# Patient Record
Sex: Male | Born: 1940 | Race: White | Hispanic: No | Marital: Married | Smoking: Former smoker
Health system: Southern US, Community
[De-identification: ages and names within clinical notes are randomized; demographics above are authoritative.]

## PROBLEM LIST (undated history)

## (undated) DIAGNOSIS — I10 Essential (primary) hypertension: Secondary | ICD-10-CM

## (undated) DIAGNOSIS — R209 Unspecified disturbances of skin sensation: Secondary | ICD-10-CM

## (undated) DIAGNOSIS — R972 Elevated prostate specific antigen [PSA]: Secondary | ICD-10-CM

## (undated) DIAGNOSIS — Z86010 Personal history of colonic polyps: Secondary | ICD-10-CM

## (undated) DIAGNOSIS — I6529 Occlusion and stenosis of unspecified carotid artery: Secondary | ICD-10-CM

## (undated) DIAGNOSIS — F528 Other sexual dysfunction not due to a substance or known physiological condition: Secondary | ICD-10-CM

## (undated) DIAGNOSIS — E785 Hyperlipidemia, unspecified: Secondary | ICD-10-CM

## (undated) DIAGNOSIS — M545 Low back pain: Secondary | ICD-10-CM

## (undated) DIAGNOSIS — M5430 Sciatica, unspecified side: Secondary | ICD-10-CM

## (undated) DIAGNOSIS — N400 Enlarged prostate without lower urinary tract symptoms: Secondary | ICD-10-CM

## (undated) DIAGNOSIS — I739 Peripheral vascular disease, unspecified: Secondary | ICD-10-CM

## (undated) DIAGNOSIS — M19049 Primary osteoarthritis, unspecified hand: Secondary | ICD-10-CM

## (undated) DIAGNOSIS — K219 Gastro-esophageal reflux disease without esophagitis: Secondary | ICD-10-CM

## (undated) HISTORY — DX: Sciatica, unspecified side: M54.30

## (undated) HISTORY — DX: Gastro-esophageal reflux disease without esophagitis: K21.9

## (undated) HISTORY — DX: Primary osteoarthritis, unspecified hand: M19.049

## (undated) HISTORY — DX: Other sexual dysfunction not due to a substance or known physiological condition: F52.8

## (undated) HISTORY — PX: PROSTATE BIOPSY: SHX241

## (undated) HISTORY — DX: Hyperlipidemia, unspecified: E78.5

## (undated) HISTORY — PX: CARPAL TUNNEL RELEASE: SHX101

## (undated) HISTORY — DX: Occlusion and stenosis of unspecified carotid artery: I65.29

## (undated) HISTORY — DX: Peripheral vascular disease, unspecified: I73.9

## (undated) HISTORY — DX: Essential (primary) hypertension: I10

## (undated) HISTORY — PX: COLONOSCOPY: SHX174

## (undated) HISTORY — DX: Low back pain: M54.5

## (undated) HISTORY — DX: Elevated prostate specific antigen (PSA): R97.20

## (undated) HISTORY — DX: Personal history of colonic polyps: Z86.010

## (undated) HISTORY — DX: Unspecified disturbances of skin sensation: R20.9

## (undated) HISTORY — DX: Benign prostatic hyperplasia without lower urinary tract symptoms: N40.0

---

## 1999-05-02 HISTORY — PX: INGUINAL HERNIA REPAIR: SHX194

## 2004-11-08 ENCOUNTER — Ambulatory Visit: Payer: Self-pay | Admitting: Internal Medicine

## 2005-11-16 ENCOUNTER — Ambulatory Visit: Payer: Self-pay | Admitting: Internal Medicine

## 2005-11-29 ENCOUNTER — Ambulatory Visit: Payer: Self-pay | Admitting: Internal Medicine

## 2006-07-05 ENCOUNTER — Ambulatory Visit: Payer: Self-pay | Admitting: Internal Medicine

## 2006-11-06 ENCOUNTER — Ambulatory Visit: Payer: Self-pay | Admitting: Internal Medicine

## 2006-11-06 LAB — CONVERTED CEMR LAB
ALT: 18 units/L (ref 0–53)
Alkaline Phosphatase: 47 units/L (ref 39–117)
BUN: 18 mg/dL (ref 6–23)
Basophils Relative: 0.4 % (ref 0.0–1.0)
Bilirubin Urine: NEGATIVE
CO2: 28 meq/L (ref 19–32)
Calcium: 9.6 mg/dL (ref 8.4–10.5)
Creatinine, Ser: 1 mg/dL (ref 0.4–1.5)
Hemoglobin: 15.2 g/dL (ref 13.0–17.0)
Leukocytes, UA: NEGATIVE
Monocytes Absolute: 0.5 10*3/uL (ref 0.2–0.7)
Monocytes Relative: 9.4 % (ref 3.0–11.0)
Nitrite: NEGATIVE
Platelets: 236 10*3/uL (ref 150–400)
RDW: 12.9 % (ref 11.5–14.6)
Specific Gravity, Urine: >=1.03 (ref 1.000–1.03)
Total Bilirubin: 1.3 mg/dL — ABNORMAL HIGH (ref 0.3–1.2)
Total Protein, Urine: NEGATIVE mg/dL
Total Protein: 6.8 g/dL (ref 6.0–8.3)
Triglycerides: 51 mg/dL (ref 0–149)
VLDL: 10 mg/dL (ref 0–40)
WBC: 5.3 10*3/uL (ref 4.5–10.5)
pH: 6 (ref 5.0–8.0)

## 2006-11-13 ENCOUNTER — Ambulatory Visit: Payer: Self-pay | Admitting: Internal Medicine

## 2006-11-15 DIAGNOSIS — K219 Gastro-esophageal reflux disease without esophagitis: Secondary | ICD-10-CM | POA: Insufficient documentation

## 2006-11-15 DIAGNOSIS — M5430 Sciatica, unspecified side: Secondary | ICD-10-CM | POA: Insufficient documentation

## 2006-11-15 DIAGNOSIS — E785 Hyperlipidemia, unspecified: Secondary | ICD-10-CM

## 2006-11-15 DIAGNOSIS — Z860100 Personal history of colon polyps, unspecified: Secondary | ICD-10-CM | POA: Insufficient documentation

## 2006-11-15 DIAGNOSIS — F528 Other sexual dysfunction not due to a substance or known physiological condition: Secondary | ICD-10-CM

## 2006-11-15 DIAGNOSIS — N400 Enlarged prostate without lower urinary tract symptoms: Secondary | ICD-10-CM

## 2006-11-15 DIAGNOSIS — I10 Essential (primary) hypertension: Secondary | ICD-10-CM

## 2006-11-15 HISTORY — DX: Hyperlipidemia, unspecified: E78.5

## 2006-11-15 HISTORY — DX: Sciatica, unspecified side: M54.30

## 2006-11-15 HISTORY — DX: Gastro-esophageal reflux disease without esophagitis: K21.9

## 2006-11-15 HISTORY — DX: Other sexual dysfunction not due to a substance or known physiological condition: F52.8

## 2006-11-15 HISTORY — DX: Personal history of colonic polyps: Z86.010

## 2006-11-15 HISTORY — DX: Personal history of colon polyps, unspecified: Z86.0100

## 2006-11-15 HISTORY — DX: Benign prostatic hyperplasia without lower urinary tract symptoms: N40.0

## 2006-11-15 HISTORY — DX: Essential (primary) hypertension: I10

## 2007-01-30 ENCOUNTER — Ambulatory Visit: Payer: Self-pay | Admitting: Internal Medicine

## 2007-01-30 LAB — CONVERTED CEMR LAB
ALT: 22 U/L
AST: 25 U/L
Albumin: 3.8 g/dL
Alkaline Phosphatase: 60 U/L
Bilirubin, Direct: 0.2 mg/dL
Cholesterol: 161 mg/dL
HDL: 60.2 mg/dL
LDL Cholesterol: 91 mg/dL
PSA, Free Pct: 17 — ABNORMAL LOW
PSA, Free: 0.6 ng/mL
PSA: 3.41 ng/mL
PSA: 3.54 ng/mL
Total Bilirubin: 1.1 mg/dL
Total CHOL/HDL Ratio: 2.7
Total Protein: 6.9 g/dL
Triglycerides: 51 mg/dL
VLDL: 10 mg/dL

## 2007-02-28 ENCOUNTER — Telehealth (INDEPENDENT_AMBULATORY_CARE_PROVIDER_SITE_OTHER): Payer: Self-pay | Admitting: *Deleted

## 2007-07-18 ENCOUNTER — Encounter: Payer: Self-pay | Admitting: Internal Medicine

## 2007-11-12 ENCOUNTER — Ambulatory Visit: Payer: Self-pay | Admitting: Internal Medicine

## 2007-11-12 LAB — CONVERTED CEMR LAB
ALT: 18 units/L (ref 0–53)
AST: 20 units/L (ref 0–37)
Albumin: 4.1 g/dL (ref 3.5–5.2)
BUN: 18 mg/dL (ref 6–23)
Basophils Absolute: 0 10*3/uL (ref 0.0–0.1)
Basophils Relative: 0.1 % (ref 0.0–1.0)
CO2: 28 meq/L (ref 19–32)
Calcium: 9.5 mg/dL (ref 8.4–10.5)
Cholesterol: 155 mg/dL (ref 0–200)
Creatinine, Ser: 1.1 mg/dL (ref 0.4–1.5)
Eosinophils Relative: 3.2 % (ref 0.0–5.0)
Glucose, Bld: 99 mg/dL (ref 70–99)
Hemoglobin: 15.6 g/dL (ref 13.0–17.0)
Ketones, ur: NEGATIVE mg/dL
Leukocytes, UA: NEGATIVE
Lymphocytes Relative: 25.5 % (ref 12.0–46.0)
MCHC: 34.7 g/dL (ref 30.0–36.0)
Monocytes Relative: 8 % (ref 3.0–12.0)
Neutro Abs: 3.5 10*3/uL (ref 1.4–7.7)
PSA: 4.81 ng/mL — ABNORMAL HIGH (ref 0.10–4.00)
RBC: 4.87 M/uL (ref 4.22–5.81)
Specific Gravity, Urine: 1.025 (ref 1.000–1.03)
TSH: 0.91 microintl units/mL (ref 0.35–5.50)
Total CHOL/HDL Ratio: 2.8
Total Protein: 7 g/dL (ref 6.0–8.3)
Triglycerides: 62 mg/dL (ref 0–149)
Urine Glucose: NEGATIVE mg/dL
WBC: 5.7 10*3/uL (ref 4.5–10.5)
pH: 6 (ref 5.0–8.0)

## 2007-11-14 ENCOUNTER — Ambulatory Visit: Payer: Self-pay | Admitting: Internal Medicine

## 2007-11-14 DIAGNOSIS — M545 Low back pain, unspecified: Secondary | ICD-10-CM

## 2007-11-14 DIAGNOSIS — R972 Elevated prostate specific antigen [PSA]: Secondary | ICD-10-CM | POA: Insufficient documentation

## 2007-11-14 DIAGNOSIS — I6529 Occlusion and stenosis of unspecified carotid artery: Secondary | ICD-10-CM

## 2007-11-14 HISTORY — DX: Occlusion and stenosis of unspecified carotid artery: I65.29

## 2007-11-14 HISTORY — DX: Low back pain, unspecified: M54.50

## 2007-11-14 HISTORY — DX: Elevated prostate specific antigen (PSA): R97.20

## 2007-11-19 ENCOUNTER — Ambulatory Visit: Payer: Self-pay

## 2007-11-19 ENCOUNTER — Encounter: Payer: Self-pay | Admitting: Internal Medicine

## 2007-11-20 ENCOUNTER — Encounter: Payer: Self-pay | Admitting: Internal Medicine

## 2008-11-09 ENCOUNTER — Encounter: Payer: Self-pay | Admitting: Internal Medicine

## 2008-11-16 ENCOUNTER — Ambulatory Visit: Payer: Self-pay | Admitting: Internal Medicine

## 2008-11-16 LAB — CONVERTED CEMR LAB
AST: 19 units/L (ref 0–37)
Albumin: 4 g/dL (ref 3.5–5.2)
Alkaline Phosphatase: 47 units/L (ref 39–117)
Basophils Absolute: 0 10*3/uL (ref 0.0–0.1)
Basophils Relative: 0.1 % (ref 0.0–3.0)
Bilirubin Urine: NEGATIVE
CO2: 29 meq/L (ref 19–32)
Eosinophils Absolute: 0.1 10*3/uL (ref 0.0–0.7)
Glucose, Bld: 89 mg/dL (ref 70–99)
HCT: 47 % (ref 39.0–52.0)
Hemoglobin, Urine: NEGATIVE
Hemoglobin: 16.2 g/dL (ref 13.0–17.0)
Ketones, ur: NEGATIVE mg/dL
Leukocytes, UA: NEGATIVE
Lymphocytes Relative: 23.6 % (ref 12.0–46.0)
Lymphs Abs: 1.5 10*3/uL (ref 0.7–4.0)
MCHC: 34.6 g/dL (ref 30.0–36.0)
Monocytes Relative: 7.2 % (ref 3.0–12.0)
Neutro Abs: 4.2 10*3/uL (ref 1.4–7.7)
Potassium: 4 meq/L (ref 3.5–5.1)
RBC: 5.16 M/uL (ref 4.22–5.81)
RDW: 12.8 % (ref 11.5–14.6)
Sodium: 141 meq/L (ref 135–145)
Specific Gravity, Urine: 1.015 (ref 1.000–1.030)
TSH: 0.82 microintl units/mL (ref 0.35–5.50)
Total CHOL/HDL Ratio: 3
Total Protein: 7 g/dL (ref 6.0–8.3)
Urine Glucose: NEGATIVE mg/dL
Urobilinogen, UA: 0.2 (ref 0.0–1.0)

## 2008-11-23 ENCOUNTER — Ambulatory Visit: Payer: Self-pay | Admitting: Internal Medicine

## 2008-11-23 DIAGNOSIS — R209 Unspecified disturbances of skin sensation: Secondary | ICD-10-CM | POA: Insufficient documentation

## 2008-11-23 HISTORY — DX: Unspecified disturbances of skin sensation: R20.9

## 2009-03-08 ENCOUNTER — Ambulatory Visit: Payer: Self-pay | Admitting: Internal Medicine

## 2009-03-08 LAB — CONVERTED CEMR LAB
Alkaline Phosphatase: 56 units/L (ref 39–117)
Bilirubin, Direct: 0.1 mg/dL (ref 0.0–0.3)
Cholesterol: 172 mg/dL (ref 0–200)
LDL Cholesterol: 90 mg/dL (ref 0–99)
Total Bilirubin: 0.9 mg/dL (ref 0.3–1.2)
Total CHOL/HDL Ratio: 3
Total Protein: 8 g/dL (ref 6.0–8.3)
VLDL: 17 mg/dL (ref 0.0–40.0)

## 2009-04-20 ENCOUNTER — Ambulatory Visit: Payer: Self-pay | Admitting: Internal Medicine

## 2009-08-12 ENCOUNTER — Encounter: Payer: Self-pay | Admitting: Internal Medicine

## 2009-10-04 ENCOUNTER — Ambulatory Visit: Payer: Self-pay | Admitting: Internal Medicine

## 2009-10-04 DIAGNOSIS — M25519 Pain in unspecified shoulder: Secondary | ICD-10-CM

## 2009-11-02 ENCOUNTER — Encounter: Payer: Self-pay | Admitting: Internal Medicine

## 2009-11-03 ENCOUNTER — Telehealth (INDEPENDENT_AMBULATORY_CARE_PROVIDER_SITE_OTHER): Payer: Self-pay | Admitting: *Deleted

## 2009-11-16 ENCOUNTER — Encounter: Payer: Self-pay | Admitting: Internal Medicine

## 2009-11-17 ENCOUNTER — Encounter: Payer: Self-pay | Admitting: Internal Medicine

## 2009-11-17 ENCOUNTER — Ambulatory Visit: Payer: Self-pay

## 2009-11-19 ENCOUNTER — Ambulatory Visit: Payer: Self-pay | Admitting: Internal Medicine

## 2009-11-19 LAB — CONVERTED CEMR LAB
AST: 21 units/L (ref 0–37)
Alkaline Phosphatase: 48 units/L (ref 39–117)
BUN: 20 mg/dL (ref 6–23)
Basophils Absolute: 0 10*3/uL (ref 0.0–0.1)
Basophils Relative: 0.7 % (ref 0.0–3.0)
Bilirubin, Direct: 0.1 mg/dL (ref 0.0–0.3)
CO2: 28 meq/L (ref 19–32)
Chloride: 106 meq/L (ref 96–112)
Cholesterol: 156 mg/dL (ref 0–200)
Creatinine, Ser: 0.9 mg/dL (ref 0.4–1.5)
Eosinophils Absolute: 0.2 10*3/uL (ref 0.0–0.7)
Glucose, Bld: 91 mg/dL (ref 70–99)
Hemoglobin: 15.1 g/dL (ref 13.0–17.0)
LDL Cholesterol: 80 mg/dL (ref 0–99)
Lymphocytes Relative: 21.8 % (ref 12.0–46.0)
MCHC: 34 g/dL (ref 30.0–36.0)
Monocytes Relative: 8.2 % (ref 3.0–12.0)
Neutrophils Relative %: 66.5 % (ref 43.0–77.0)
Nitrite: NEGATIVE
Potassium: 5 meq/L (ref 3.5–5.1)
RBC: 4.77 M/uL (ref 4.22–5.81)
Total CHOL/HDL Ratio: 2
Total Protein, Urine: NEGATIVE mg/dL
Total Protein: 6.6 g/dL (ref 6.0–8.3)
Urine Glucose: NEGATIVE mg/dL
VLDL: 12.4 mg/dL (ref 0.0–40.0)
pH: 7 (ref 5.0–8.0)

## 2009-11-24 ENCOUNTER — Encounter: Payer: Self-pay | Admitting: Internal Medicine

## 2009-11-25 ENCOUNTER — Ambulatory Visit: Payer: Self-pay | Admitting: Internal Medicine

## 2009-11-25 DIAGNOSIS — I739 Peripheral vascular disease, unspecified: Secondary | ICD-10-CM | POA: Insufficient documentation

## 2009-11-25 HISTORY — DX: Peripheral vascular disease, unspecified: I73.9

## 2009-12-01 ENCOUNTER — Encounter (INDEPENDENT_AMBULATORY_CARE_PROVIDER_SITE_OTHER): Payer: Self-pay | Admitting: *Deleted

## 2009-12-31 ENCOUNTER — Encounter (INDEPENDENT_AMBULATORY_CARE_PROVIDER_SITE_OTHER): Payer: Self-pay | Admitting: *Deleted

## 2010-01-04 ENCOUNTER — Ambulatory Visit: Payer: Self-pay | Admitting: Internal Medicine

## 2010-01-18 ENCOUNTER — Ambulatory Visit: Payer: Self-pay | Admitting: Internal Medicine

## 2010-01-18 LAB — HM COLONOSCOPY

## 2010-03-02 ENCOUNTER — Encounter: Payer: Self-pay | Admitting: Internal Medicine

## 2010-05-31 NOTE — Consult Note (Signed)
**Note De-Identified Channie Bostick Obfuscation** Summary: [facility]   Imported By: Lester Walton 03/10/2010 10:41:52  _____________________________________________________________________  External Attachment:    Type:   Image     Comment:   External Document

## 2010-05-31 NOTE — Miscellaneous (Signed)
Summary: Orders Update   Clinical Lists Changes  Orders: Added new Referral order of Radiology Referral (Radiology) - Signed 

## 2010-05-31 NOTE — Assessment & Plan Note (Signed)
Summary: CPX/ WILL CALL INSURANCE TO SEE IF PAYS FOR LABS PRIOR/NWS   Vital Signs:  Patient profile:   70 year old male Height:      68 inches (172.72 cm) Weight:      182.75 pounds (83.07 kg) BMI:     27.89 O2 Sat:      97 % on Room air Temp:     97.8 degrees F (36.56 degrees C) oral Pulse rate:   61 / minute BP sitting:   124 / 82  (left arm) Cuff size:   regular  Vitals Entered By: Brenton Grills MA (November 25, 2009 8:34 AM)  O2 Flow:  Room air  CC: Physical/pt is also taking Fish Oil/aj   CC:  Physical/pt is also taking Fish Oil/aj.  History of Present Illness: here to f/u - is considering a seoncd opinion urology after saw Dr Annabell Howells yesterday who reco'd repeat biopsy but he is just not sure about doing it;  Pt denies CP, sob, doe, wheezing, orthopnea, pnd, worsening LE edema, palps, dizziness or syncope  Pt denies new neuro symptoms such as headache, facial or extremity weakness  Trying to remain active, plays golf among other sports and has incrsed right shoulder pain, but thinks he injured it again with a mistep off the patio at the son's house and had to flail the arm suddenly to catch himself and had severe pain to the shoudler , prednisone helped before.  No fever, wt loss, night sweats, loss of appetite or other constitutional symptoms   Does have some numbness to fingertips to the left hand with driving but goes away in a few minutes with change of hand position.    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 1 By Mouth Once Daily 2)  Omeprazole 20 Mg Cpdr (Omeprazole) .Marland Kitchen.. 1 By Mouth Once Daily 3)  Adult Aspirin Ec Low Strength 81 Mg  Tbec (Aspirin) .Marland Kitchen.. 1 By Mouth Once Daily 4)  Multi Vitamin .Marland Kitchen.. 1 By Mouth Once Daily 5)  Flaxseed .Marland Kitchen.. 1 By Mouth Once Daily 6)  Asprin 81mg  .... 1 By Mouth Once Daily 7)  Garlic .Marland Kitchen.. 1 By Mouth Once Daily 8)  Borage Oil 1000 Mg Caps (Borage (Borago Officinalis)) .Marland Kitchen.. 1 By Mouth Once Daily 9)  Finasteride 5 Mg Tabs (Finasteride) .Marland Kitchen.. 1 By Mouth Once Daily 10)  Viagra 100 Mg Tabs (Sildenafil Citrate) .Marland Kitchen.. 1 By Mouth As Needed 11)  Prednisone 10 Mg Tabs (Prednisone) .... 3po Qd For 3days, Then 2po Qd For 3days, Then 1po Qd For 3days, Then Stop 12)  Tylenol With Codeine #3 300-30 Mg Tabs (Acetaminophen-Codeine) .Marland Kitchen.. 1po Q 6 Hrs As Needed  Current Medications (verified): 1)  Pravachol 40 Mg Tabs (Pravastatin Sodium) .Marland Kitchen.. 1 By Mouth Once Daily 2)  Omeprazole 20 Mg Cpdr (Omeprazole) .Marland Kitchen.. 1 By Mouth Once Daily As  Needed 3)  Adult Aspirin Ec Low Strength 81 Mg  Tbec (Aspirin) .Marland Kitchen.. 1 By Mouth Once Daily 4)  Multi Vitamin .Marland Kitchen.. 1 By Mouth Once Daily 5)  Flaxseed .Marland Kitchen.. 1 By Mouth Once Daily 6)  Asprin 81mg  .... 1 By Mouth Once Daily 7)  Garlic .Marland Kitchen.. 1 By Mouth Once Daily 8)  Borage Oil 1000 Mg Caps (Borage (Borago Officinalis)) .Marland Kitchen.. 1 By Mouth Once Daily 9)  Finasteride 5 Mg Tabs (Finasteride) .Marland Kitchen.. 1 By Mouth Once Daily 10)  Cialis 20 Mg Tabs (Tadalafil) .Marland Kitchen.. 1 By Mouth As Needed 11)  Prednisone 10 Mg Tabs (Prednisone) .... 3po Qd For 3days, Then 2po Qd For 3days, Then 1po Qd For 3days, Then Stop 12)  Cinnamon 500 Mg Caps (Cinnamon) .Marland Kitchen.. 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, and supervisor  Risk Factors: Smoking Status: quit (11/14/2007)  Past Medical History: GERD Hyperlipidemia Hypertension(Borderline) Erectile Dysfunction Colonic polyps, hx of Benign prostatic hypertrophy 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         all otherwise negative per pt -    Physical Exam  General:  alert and overweight-appearing but much less so Head:  normocephalic and atraumatic.   Eyes:  vision grossly intact, pupils equal, and pupils round.   Ears:  R ear normal and L ear normal.   Nose:  no external deformity and no nasal discharge.   Mouth:  no gingival abnormalities and pharynx pink and moist.   Neck:  supple and no masses.   Lungs:  normal respiratory effort and normal breath sounds.   Heart:  normal rate and regular rhythm.   Abdomen:  soft, non-tender, and normal bowel sounds.   Msk:  no joint tenderness and no joint swelling.   Extremities:  no edema, no erythema  Neurologic:  cranial  nerves II-XII intact and strength normal in all extremities.   Skin:  color normal and no rashes.   Psych:  not depressed appearing and moderately anxious.     Impression & Recommendations:  Problem # 1:  Preventive Health Care (ICD-V70.0) Overall doing well, age appropriate education and counseling updated and referral for appropriate preventive services done unless declined, immunizations up to date or declined, diet counseling done if overweight, urged to quit smoking if smokes , most recent labs reviewed and current ordered if appropriate, ecg reviewed or declined (interpretation per ECG scanned in the EMR if done); information regarding Medicare Prevention requirements given if appropriate; speciality referrals updated as appropriate   Problem # 2:  SHOULDER PAIN, RIGHT (ICD-719.41)  The following medications were removed from the medication list:    Tylenol With Codeine #3  300-30 Mg Tabs (Acetaminophen-codeine) .Marland Kitchen... 1po q 6 hrs as needed His updated medication list for this problem includes:    Adult Aspirin Ec Low Strength 81 Mg Tbec (Aspirin) .Marland Kitchen... 1 by mouth once daily ok for repeat prednisone, consider ortho eval if returns  Problem # 3:  PSA, INCREASED (ICD-790.93) pt to consdier second opinion on rec'd repeat prostate biopsy, to call if wants referral  Problem # 4:  COLONIC POLYPS, HX OF (ICD-V12.72)  due for f/u colonoscopy - will refer  Orders: Gastroenterology Referral (GI)  Complete Medication List: 1)  Pravachol 40 Mg Tabs (Pravastatin sodium) .Marland Kitchen.. 1 by mouth once daily 2)  Omeprazole 20 Mg Cpdr (Omeprazole) .Marland Kitchen.. 1 by mouth once daily as needed 3)  Adult Aspirin Ec Low Strength 81 Mg Tbec (Aspirin) .Marland Kitchen.. 1 by mouth once daily 4)  Multi Vitamin  .Marland Kitchen.. 1 by mouth once daily 5)  Flaxseed  .Marland Kitchen.. 1 by mouth once daily 6)  Asprin 81mg   .... 1 by mouth once daily 7)  Garlic  .Marland Kitchen.. 1 by mouth once daily 8)  Borage Oil 1000 Mg Caps (Borage (borago officinalis)) .Marland Kitchen.. 1 by  mouth once daily 9)  Finasteride 5 Mg Tabs (Finasteride) .Marland Kitchen.. 1 by mouth once daily 10)  Cialis 20 Mg Tabs (Tadalafil) .Marland Kitchen.. 1 by mouth as needed 11)  Prednisone 10 Mg Tabs (Prednisone) .... 3po qd for 3days, then 2po qd for 3days, then 1po qd for 3days, then stop 12)  Cinnamon 500 Mg Caps (Cinnamon) .Marland Kitchen.. 1 once daily  Patient Instructions: 1)  Please take all new medications as prescribed - the prednisone for the shoulder (sent to the [facility] pharmacy)  2)  please call you would like referral to orthopedic, or urology as well for the second opinion 3)  You will be contacted about the referral(s) to: colonscopy 4)  Continue all previous medications as before this visit (also sent to your pharmacy on the computer) 5)  Please schedule a follow-up appointment in 1 year or sooner if needed Prescriptions: PREDNISONE 10 MG TABS (PREDNISONE) 3po qd for 3days, then 2po qd for 3days, then 1po qd for 3days, then stop  #18 x 0   Entered and Authorized by:   Corwin Levins MD   Signed by:   Corwin Levins MD on 11/25/2009   Method used:   Electronically to        [facility]  [phone]* (retail)       [address]       [place], [place]  [address]       Ph: [phone] or [phone]       Fax: [phone]   RxID:   [id number] OMEPRAZOLE 20 MG CPDR (OMEPRAZOLE) 1 by mouth once daily as needed  #90 x 3   Entered and Authorized by:   Corwin Levins MD   Signed by:   Corwin Levins MD on 11/25/2009   Method used:   Electronically to        [facility]  [phone]* (retail)       [address]       [place], [place]  [address]       Ph: [phone] or [phone]       Fax: [phone]   RxID:   [id number] PRAVACHOL 40 MG TABS (PRAVASTATIN SODIUM) 1 by mouth once daily  #90 x 3  Entered and Authorized by:   Corwin Levins MD   Signed by:   Corwin Levins MD on 11/25/2009   Method used:   Electronically to        [facility]  [phone]* (retail)       [address]       [place], [place]  [address]       Ph: [phone] or [phone]       Fax: [phone]   RxID:   [id number]

## 2010-05-31 NOTE — Letter (Signed)
Summary: [facility]   Imported By: Lennie Odor 12/02/2009 11:00:08  _____________________________________________________________________  External Attachment:    Type:   Image     Comment:   External Document

## 2010-05-31 NOTE — Letter (Signed)
Summary: [facility]   Imported By: Sherian Rein 08/17/2009 11:59:13  _____________________________________________________________________  External Attachment:    Type:   Image     Comment:   External Document

## 2010-05-31 NOTE — Progress Notes (Signed)
----   Converted from flag ---- ---- 11/03/2009 8:44 AM, Edman Circle wrote: appt 7/15 @ 2:00  ---- 11/03/2009 8:36 AM, Dagoberto Reef wrote: Thanks  ---- 11/02/2009 5:35 PM, Corwin Levins MD wrote: The following orders have been entered for this patient and placed on Admin Hold:  Type:     Referral       Code:   Radiology Description:   Radiology Referral Order Date:   11/02/2009   Authorized By:   Corwin Levins MD Order #:   [id number] Clinical Notes:   Name of Test or Procedure: carotid dopplers  Of What:  Special Instructions ------------------------------

## 2010-05-31 NOTE — Procedures (Signed)
Summary: Colonoscopy  Patient: Brandon [name] Note: All result statuses are Final unless otherwise noted.  Tests: (1) Colonoscopy (COL)   COL Colonoscopy           DONE     [facility]     [address]     [place], [place]  [address]           COLONOSCOPY PROCEDURE REPORT           PATIENT:  Brandon [name], Brandon [name]  MR#:  [id number]     BIRTHDATE:  [birth year], 69 yrs. old  GENDER:  male     ENDOSCOPIST:  Iva Boop, MD, [facility]     REF. BY:  Oliver Barre, M.D.     PROCEDURE DATE:  01/18/2010     PROCEDURE:  Colonoscopy [id number]     ASA CLASS:  Class II     INDICATIONS:  surveillance and high-risk screening, history of     polyps he reports colonoscopic polypectomy in 1990's,     colonoscopies subsequently in 1999 and 2006 without polyps     ([place], [place])     MEDICATIONS:   Fentanyl 75 mcg IV, Versed 6 mg IV           DESCRIPTION OF PROCEDURE:   After the risks benefits and     alternatives of the procedure were thoroughly explained, informed     consent was obtained.  Digital rectal exam was performed and     revealed no rectal masses and an enlarged prostate.  He has known     BPH. The LB [id number] endoscope was introduced through the     anus and advanced to the cecum, which was identified by both the     appendix and ileocecal valve, without limitations.  The quality of     the prep was good, using MoviPrep.  The instrument was then slowly     withdrawn as the colon was fully examined.     Insertion: 3:09 minutes Withdrawal: 10:20 minutes     <<PROCEDUREIMAGES>>           FINDINGS:  A normal appearing cecum, ileocecal valve, and     appendiceal orifice were identified. The ascending, hepatic     flexure, transverse, splenic flexure, descending, sigmoid colon,     and rectum appeared unremarkable.   Retroflexed views in the     rectum revealed no abnormalities.    The scope was then withdrawn     from the patient and the procedure completed.        COMPLICATIONS:  None     ENDOSCOPIC IMPRESSION:     1) Normal colonoscopy,good prep     2) reported polyps in 1990's, none since (1999, 2006 and today)                 REPEAT EXAM:  In 10 year(s) for routine screening colonoscopy.  If     health and functional status at that time indicate he is     appropriate for screening.           Iva Boop, MD, [facility]           CC:  Corwin Levins, MD     The Patient           n.     Rosalie Doctor:   Iva Boop at 01/18/2010 12:19 PM           Ivory Broad, [id number]  Note:  An exclamation mark (!) indicates a result that was not dispersed into the flowsheet. Document Creation Date: 01/18/2010 12:19 PM _______________________________________________________________________  (1) Order result status: Final Collection or observation date-time: 01/18/2010 12:09 Requested date-time:  Receipt date-time:  Reported date-time:  Referring Physician:   Ordering Physician: Stan Head [phone]) Specimen Source:  Source: Launa Grill Order Number: [id number] Lab site:   Appended Document: Colonoscopy    Clinical Lists Changes  Observations: Added new observation of COLONNXTDUE: 12/2019 (01/18/2010 13:04)

## 2010-05-31 NOTE — Miscellaneous (Signed)
Summary: Orders Update  Clinical Lists Changes  Orders: Added new Test order of Carotid Duplex (Carotid Duplex) - Signed 

## 2010-05-31 NOTE — Assessment & Plan Note (Signed)
Summary: injured right shoulder doing yard work-lb   Vital Signs:  Patient profile:   70 year old male Height:      68 inches Weight:      189.75 pounds BMI:     28.96 O2 Sat:      96 % on Room air Temp:     99 degrees F oral Pulse rate:   61 / minute BP sitting:   120 / 72  (left arm) Cuff size:   regular  Vitals Entered ByZella Ball Ewing (October 04, 2009 3:24 PM)  O2 Flow:  Room air CC: Right Shoulder injury/RE   CC:  Right Shoulder injury/RE.  History of Present Illness: here with acute onset right shoulder pain for one wk, mild to mod, after wroking in the yard quite vigorously with trimming, cut trees with chainsaw;  also slipped and fell about the same time adn landed on the outstretched right arm without seeming injury at the time;  pain is loacted mostly to the anterior right shoudler, worse to forward elevate but has FROM, worse to lie on the right side at night, can tolerate the pain during the day;  denies neck pain or back pain, is right handed;  no diestal RUE numbness or weakness.  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 1 By Mouth Once Daily 2)  Omeprazole 20 Mg Cpdr  (Omeprazole) .Marland Kitchen.. 1 By Mouth Once Daily 3)  Adult Aspirin Ec Low Strength 81 Mg  Tbec (Aspirin) .Marland Kitchen.. 1 By Mouth Once Daily 4)  Multi Vitamin .Marland Kitchen.. 1 By Mouth Once Daily 5)  Flaxseed .Marland Kitchen.. 1 By Mouth Once Daily 6)  Asprin 81mg  .... 1 By Mouth Once Daily 7)  Garlic .Marland Kitchen.. 1 By Mouth Once Daily 8)  Borage Oil 1000 Mg Caps (Borage (Borago Officinalis)) .Marland Kitchen.. 1 By Mouth Once Daily 9)  Finasteride 5 Mg Tabs (Finasteride) .Marland Kitchen.. 1 By Mouth Once Daily 10)  Viagra 100 Mg Tabs (Sildenafil Citrate) .Marland Kitchen.. 1 By Mouth As Needed 11)  Prednisone 10 Mg Tabs (Prednisone) .... 3po Qd For 3days, Then 2po Qd For 3days, Then 1po Qd For 3days, Then Stop  Current Medications (verified): 1)  Pravachol 40 Mg Tabs (Pravastatin Sodium) .Marland Kitchen.. 1 By Mouth Once Daily 2)  Omeprazole 20 Mg Cpdr (Omeprazole) .Marland Kitchen.. 1 By Mouth Once Daily 3)  Adult Aspirin Ec Low Strength 81 Mg  Tbec (Aspirin) .Marland Kitchen.. 1 By Mouth Once Daily 4)  Multi Vitamin .Marland Kitchen.. 1 By Mouth Once Daily 5)  Flaxseed .Marland Kitchen.. 1 By Mouth Once Daily 6)  Asprin 81mg  .... 1 By Mouth Once Daily 7)  Garlic .Marland Kitchen.. 1 By Mouth Once Daily 8)  Borage Oil 1000 Mg Caps (Borage (Borago Officinalis)) .Marland KitchenMarland KitchenMarland Kitchen  1 By Mouth Once Daily 9)  Finasteride 5 Mg Tabs (Finasteride) .Marland Kitchen.. 1 By Mouth Once Daily 10)  Viagra 100 Mg Tabs (Sildenafil Citrate) .Marland Kitchen.. 1 By Mouth As Needed 11)  Prednisone 10 Mg Tabs (Prednisone) .... 3po Qd For 3days, Then 2po Qd For 3days, Then 1po Qd For 3days, Then Stop 12)  Tylenol With Codeine #3 300-30 Mg Tabs (Acetaminophen-Codeine) .Marland Kitchen.. 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   Head:  normocephalic and atraumatic.   Eyes:  vision grossly intact, pupils equal, and pupils round.   Ears:  R ear normal and L ear normal.   Nose:  no external deformity and no external erythema.   Mouth:  no gingival abnormalities and pharynx pink and moist.   Neck:  supple and no masses.   Lungs:  normal respiratory effort and normal breath sounds.   Heart:  normal rate and regular rhythm.   Msk:  right shoudler with FROM though seems mild stiff;  no significant tender to the [facility] joint, rotater cuff or bursa or deltoid area Extremities:  no edema, no erythema  Neurologic:  strength normal in upper extremities and DTRs symmetrical and normal.     Impression & Recommendations:  Problem # 1:  SHOULDER PAIN, RIGHT (ICD-719.41)  His updated medication list for this problem includes:    Adult Aspirin Ec Low Strength 81 Mg Tbec (Aspirin) .Marland Kitchen... 1 by mouth once daily    Tylenol With Codeine #3 300-30 Mg Tabs (Acetaminophen-codeine) .Marland Kitchen... 1po q 6 hrs as needed c/w shoulder strain due to exertion,  ok to follow, treat as above, f/u any worsening signs or symptoms   Problem # 2:  HYPERTENSION (ICD-401.9)  BP today: 120/72 Prior BP: 144/90 (03/08/2009)  Labs Reviewed: K+: 4.0 (11/16/2008) Creat: : 1.0 (11/16/2008)   Chol: 172 (03/08/2009)   HDL: 65.40 (03/08/2009)   LDL: 90 (03/08/2009)   TG: 85.0 (03/08/2009) stable overall by hx and exam, ok to continue meds/tx as is   Complete Medication List: 1)  Pravachol 40 Mg Tabs (Pravastatin sodium) .Marland Kitchen.. 1 by mouth once daily 2)  Omeprazole 20 Mg Cpdr (Omeprazole)  .Marland Kitchen.. 1 by mouth once daily 3)  Adult Aspirin Ec Low Strength 81 Mg Tbec (Aspirin) .Marland Kitchen.. 1 by mouth once daily 4)  Multi Vitamin  .Marland Kitchen.. 1 by mouth once daily 5)  Flaxseed  .Marland Kitchen.. 1 by mouth once daily 6)  Asprin 81mg   .... 1 by mouth once daily 7)  Garlic  .Marland Kitchen.. 1 by mouth once daily 8)  Borage Oil 1000 Mg Caps (Borage (borago officinalis)) .Marland Kitchen.. 1 by mouth once daily 9)  Finasteride 5 Mg Tabs (Finasteride) .Marland Kitchen.. 1 by mouth once daily 10)  Viagra 100 Mg Tabs (Sildenafil citrate) .Marland Kitchen.. 1 by mouth as needed 11)  Prednisone 10 Mg Tabs (Prednisone) .Marland KitchenMarland KitchenMarland Kitchen  3po qd for 3days, then 2po qd for 3days, then 1po qd for 3days, then stop 12)  Tylenol With Codeine #3 300-30 Mg Tabs (Acetaminophen-codeine) .Marland Kitchen.. 1po q 6 hrs as needed  Patient Instructions: 1)  Please take all new medications as prescribed 2)  Continue all previous medications as before this visit  3)  Please schedule a follow-up appointment as planned next month Prescriptions: TYLENOL WITH CODEINE #3 300-30 MG TABS (ACETAMINOPHEN-CODEINE) 1po q 6 hrs as needed  #30 x 0   Entered and Authorized by:   Corwin Levins MD   Signed by:   Corwin Levins MD on 10/04/2009   Method used:   Print then Give to Patient   RxID:   [id number] PREDNISONE 10 MG TABS (PREDNISONE) 3po qd for 3days, then 2po qd for 3days, then 1po qd for 3days, then stop  #18 x 0   Entered and Authorized by:   Corwin Levins MD   Signed by:   Corwin Levins MD on 10/04/2009   Method used:   Print then Give to Patient   RxID:   [id number]

## 2010-05-31 NOTE — Letter (Signed)
Summary: [facility]   Imported By: Sherian Rein 03/14/2010 07:41:32  _____________________________________________________________________  External Attachment:    Type:   Image     Comment:   External Document

## 2010-05-31 NOTE — Letter (Signed)
Summary: [facility] Instructions   Gastroenterology  [address] [place], [place] [address]   Phone: [phone]  Fax: [phone]       Brandon [name]    [birth year]    MRN: [id number]        Procedure Day /Date: Tuesday  01-18-10     Arrival Time: 10:00 a.m.     Procedure Time: 11:00 a.m.     Location of Procedure:                    _x_   [facility] (4th Floor)                       PREPARATION FOR COLONOSCOPY WITH MOVIPREP   Starting 5 days prior to your procedure  01-13-10 do not eat nuts, seeds, popcorn, corn, beans, peas,  salads, or any raw vegetables.  Do not take any fiber supplements (e.g. Metamucil, Citrucel, and Benefiber).  THE DAY BEFORE YOUR PROCEDURE         DATE:  01-17-10  DAY:  Monday  1.  Drink clear liquids the entire day-NO SOLID FOOD  2.  Do not drink anything colored red or purple.  Avoid juices with pulp.  No orange juice.  3.  Drink at least 64 oz. (8 glasses) of fluid/clear liquids during the day to prevent dehydration and help the prep work efficiently.  CLEAR LIQUIDS INCLUDE: Water Jello Ice Popsicles Tea (sugar ok, no milk/cream) Powdered fruit flavored drinks Coffee (sugar ok, no milk/cream) Gatorade Juice: apple, white grape, white cranberry  Lemonade Clear bullion, consomm, broth Carbonated beverages (any kind) Strained chicken noodle soup Hard Candy                             4.  In the morning, mix first dose of MoviPrep solution:    Empty 1 Pouch A and 1 Pouch B into the disposable container    Add lukewarm drinking water to the top line of the container. Mix to dissolve    Refrigerate (mixed solution should be used within 24 hrs)  5.  Begin drinking the prep at 5:00 p.m. The MoviPrep container is divided by 4 marks.   Every 15 minutes drink the solution down to the next mark (approximately 8 oz) until the full liter is complete.   6.  Follow completed prep with 16 oz of clear liquid of your choice  (Nothing red or purple).  Continue to drink clear liquids until bedtime.  7.  Before going to bed, mix second dose of MoviPrep solution:    Empty 1 Pouch A and 1 Pouch B into the disposable container    Add lukewarm drinking water to the top line of the container. Mix to dissolve    Refrigerate  THE DAY OF YOUR PROCEDURE      DATE:  01-18-10  DAY: Tuesday  Beginning at  6:00 a.m. (5 hours before procedure):         1. Every 15 minutes, drink the solution down to the next mark (approx 8 oz) until the full liter is complete.  2. Follow completed prep with 16 oz. of clear liquid of your choice.    3. You may drink clear liquids until  9:00 a.m.  (2 HOURS BEFORE PROCEDURE).   MEDICATION INSTRUCTIONS  Unless otherwise instructed, you should take regular prescription medications with a small sip of water  as early as possible the morning of your procedure.        OTHER INSTRUCTIONS  You will need a responsible adult at least 71 years of age to accompany you and drive you home.   This person must remain in the waiting room during your procedure.  Wear loose fitting clothing that is easily removed.  Leave jewelry and other valuables at home.  However, you may wish to bring a book to read or  an iPod/MP3 player to listen to music as you wait for your procedure to start.  Remove all body piercing jewelry and leave at home.  Total time from sign-in until discharge is approximately 2-3 hours.  You should go home directly after your procedure and rest.  You can resume normal activities the  day after your procedure.  The day of your procedure you should not:   Drive   Make legal decisions   Operate machinery   Drink alcohol   Return to work  You will receive specific instructions about eating, activities and medications before you leave.    The above instructions have been reviewed and explained to me by   Wyona Almas RN  January 04, 2010 3:02 PM     I  fully understand and can verbalize these instructions _____________________________ Date _________

## 2010-05-31 NOTE — Miscellaneous (Signed)
Summary: [facility] Previsit/prep  Clinical Lists Changes  Medications: Added new medication of MOVIPREP 100 GM  SOLR (PEG-KCL-NACL-NASULF-NA ASC-C) As per prep instructions. - Signed Rx of MOVIPREP 100 GM  SOLR (PEG-KCL-NACL-NASULF-NA ASC-C) As per prep instructions.;  #1 x 0;  Signed;  Entered by: Wyona Almas RN;  Authorized by: Iva Boop MD, [facility];  Method used: Electronically to [facility]  [phone]*, [address] [place], [place], [place]  [address], Ph: [phone] or [phone], Fax: [phone] Observations: Added new observation of NKA: T (01/04/2010 14:12)    Prescriptions: MOVIPREP 100 GM  SOLR (PEG-KCL-NACL-NASULF-NA ASC-C) As per prep instructions.  #1 x 0   Entered by:   Wyona Almas RN   Authorized by:   Iva Boop MD, [facility]   Signed by:   Wyona Almas RN on 01/04/2010   Method used:   Electronically to        [facility]  [phone]* (retail)       [address]       [place], [place]  [address]       Ph: [phone] or [phone]       Fax: [phone]   RxID:   [id number]

## 2010-05-31 NOTE — Letter (Signed)
Summary: Previsit letter  [facility] Gastroenterology  [address] [place], [place] [address]   Phone: [phone]  Fax: [phone]       12/01/2009 MRN: [id number]  Brandon [name] [address] [place], [place]  [address]  Dear Brandon [name],  Welcome to the Gastroenterology Division at [facility].    You are scheduled to see a nurse for your pre-procedure visit on 01-13-10 at 8:00a.m. on the 3rd floor at [facility], [address]  We ask that you try to arrive at our office 15 minutes prior to your appointment time to allow for check-in.  Your nurse visit will consist of discussing your medical and surgical history, your immediate family medical history, and your medications.    Please bring a complete list of all your medications or, if you prefer, bring the medication bottles and we will list them.  We will need to be aware of both prescribed and over the counter drugs.  We will need to know exact dosage information as well.  If you are on blood thinners (Coumadin, Plavix, Aggrenox, Ticlid, etc.) please call our office today/prior to your appointment, as we need to consult with your physician about holding your medication.   Please be prepared to read and sign documents such as consent forms, a financial agreement, and acknowledgement forms.  If necessary, and with your consent, a friend or relative is welcome to sit-in on the nurse visit with you.  Please bring your insurance card so that we may make a copy of it.  If your insurance requires a referral to see a specialist, please bring your referral form from your primary care physician.  No co-pay is required for this nurse visit.     If you cannot keep your appointment, please call [phone] to cancel or reschedule prior to your appointment date.  This allows [place] the opportunity to schedule an appointment for another patient in need of care.    Thank you for choosing [facility] for your  medical needs.  We appreciate the opportunity to care for you.  Please visit [place] at our website  to learn more about our practice.                     Sincerely.                                                                                                                   The Gastroenterology Division

## 2010-08-12 ENCOUNTER — Encounter: Payer: Self-pay | Admitting: Internal Medicine

## 2010-08-12 DIAGNOSIS — Z0000 Encounter for general adult medical examination without abnormal findings: Secondary | ICD-10-CM | POA: Insufficient documentation

## 2010-08-15 ENCOUNTER — Ambulatory Visit (INDEPENDENT_AMBULATORY_CARE_PROVIDER_SITE_OTHER): Payer: Medicare Other | Admitting: Internal Medicine

## 2010-08-15 ENCOUNTER — Encounter: Payer: Self-pay | Admitting: Internal Medicine

## 2010-08-15 VITALS — BP 120/76 | HR 72 | Temp 98.2°F | Resp 14 | Ht 68.0 in | Wt 187.0 lb

## 2010-08-15 DIAGNOSIS — I10 Essential (primary) hypertension: Secondary | ICD-10-CM

## 2010-08-15 DIAGNOSIS — Z0000 Encounter for general adult medical examination without abnormal findings: Secondary | ICD-10-CM

## 2010-08-15 DIAGNOSIS — M79609 Pain in unspecified limb: Secondary | ICD-10-CM

## 2010-08-15 DIAGNOSIS — R209 Unspecified disturbances of skin sensation: Secondary | ICD-10-CM

## 2010-08-15 DIAGNOSIS — E785 Hyperlipidemia, unspecified: Secondary | ICD-10-CM

## 2010-08-15 MED ORDER — PNEUMOCOCCAL VAC POLYVALENT 25 MCG/0.5ML IJ INJ: 0.5000 mL | INJECTION | Freq: Once | INTRAMUSCULAR | Status: DC

## 2010-08-15 MED ORDER — PREDNISONE 10 MG PO TABS: 10.0000 mg | ORAL_TABLET | Freq: Every day | ORAL | Status: AC

## 2010-08-15 NOTE — Progress Notes (Signed)
Subjective:    Patient ID: Brandon [name], male    DOB: 1940-12-07, 70 y.o.   MRN: [id number]  HPI  Here to f/u with c/o 3 wks onset mild to mod left hand and distal arm numbness and burning kind of pain, without weakness or loss of grip strength, did hit the left elbow accidentally about 3 wks ago; no better with wearing the brace at night thinking he may have CTS.  Pt denies chest pain, increased sob or doe, wheezing, orthopnea, PND, increased LE swelling, palpitations, dizziness or syncope.  Pt denies new neurological symptoms such as new headache, or facial or extremity weakness or numbness   Pt denies polydipsia, polyuria.  Due to f/u with Dr Earlene Plater may 17.   Pt denies fever, wt loss, night sweats, loss of appetite, or other constitutional symptoms  Overall good compliance with treatment, and good medicine tolerability. Past Medical History  Diagnosis Date  . GERD 11/15/2006  . HYPERLIPIDEMIA 11/15/2006  . HYPERTENSION 11/15/2006    borderline  . ERECTILE DYSFUNCTION 11/15/2006  . COLONIC POLYPS, HX OF 11/15/2006  . BENIGN PROSTATIC HYPERTROPHY 11/15/2006  . 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 PSA, INCREASED 11/14/2007  . LOW BACK PAIN 11/14/2007    recurrent  . PERIPHERAL VASCULAR DISEASE 11/25/2009    right carotid 60-79% July 2011  . 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 PARESTHESIA, HANDS 11/23/2008   Past Surgical History  Procedure Date  . Inguinal hernia repair 2001  . Prostate biopsy Fall 2009    S/P-neg-Dr. Annabell Howells    reports that he has quit smoking. He does not have any smokeless tobacco history on file. He reports that he drinks alcohol. His drug history not on file. family history includes Cancer in his mother and other; Depression in his mother; Heart disease in his father; and Stroke in his father. Not on File Current Outpatient Prescriptions on File Prior to Visit  Medication Sig Dispense Refill  .  aspirin 81 MG tablet Take 81 mg by mouth daily.        Florian Buff, Borago officinalis, (BORAGE OIL) 1000 MG CAPS Take 1 capsule by mouth daily.        . Cinnamon 500 MG capsule Take 500 mg by mouth daily.        . finasteride (PROSCAR) 5 MG tablet Take 5 mg by mouth daily.        . Flaxseed, Linseed, 1000 MG CAPS Take 1 capsule by mouth daily.        . Garlic 100 MG TABS Take 1 tablet by mouth daily.        . Multiple Vitamin (MULTIVITAMIN) tablet Take 1 tablet by mouth daily.        Marland Kitchen omeprazole (PRILOSEC) 20 MG capsule Take 20 mg by mouth daily as needed.        . pravastatin (PRAVACHOL) 40 MG tablet Take 40 mg by mouth daily.        . predniSONE (DELTASONE) 10 MG tablet 3 by mouth once daily for 3 days, then 2 by mouth once daily for 3 days, then 1 by mouth once daily for 3 days, then stop       . tadalafil (CIALIS) 20 MG tablet Take 20 mg by mouth daily as needed.         No current facility-administered medications on file prior to visit.   Review of Systems All otherwise neg per pt  Objective:   Physical Exam BP 120/76  Pulse 72  Temp(Src) 98.2 F (36.8 C) (Oral)  Resp 14  Ht 5\' 8"  (1.727 m)  Wt 187 lb (84.823 kg)  BMI 28.43 kg/m2  SpO2 97% Physical Exam  VS noted Constitutional: Pt appears well-developed and well-nourished.  HENT: Head: Normocephalic.  Right Ear: External ear normal.  Left Ear: External ear normal.  Eyes: Conjunctivae and EOM are normal. Pupils are equal, round, and reactive to light.  Neck: Normal range of motion. Neck supple.  Cardiovascular: Normal rate and regular rhythm.   Pulmonary/Chest: Effort normal and breath sounds normal.  Abd:  Soft, NT, non-distended, + BS Neurological: Pt is alert. No cranial nerve deficit. UE's normal motor/dtr/sens Skin: Skin is warm. No erythema.  Psychiatric: Pt behavior is normal. Thought content normal. 1+ nervous        Assessment & Plan:   [phone] chris

## 2010-08-15 NOTE — Patient Instructions (Signed)
Take all new medications as prescribed Please call in 1 wk if not improved, for referral to Hand Surgury for evaluation Continue all other medications as before Please return in 3 mo with Lab testing done 3-5 days before

## 2010-08-15 NOTE — Assessment & Plan Note (Addendum)
?   Traumatic ulnar neuritis given the hx , vs CTS vs other  - for predpack, then to hand surgury if not improved, may need EMG/NCS,  to f/u any worsening symptoms or concerns

## 2010-08-28 ENCOUNTER — Encounter: Payer: Self-pay | Admitting: Internal Medicine

## 2010-08-28 NOTE — Assessment & Plan Note (Signed)
Due for pneumovax today 

## 2010-08-28 NOTE — Assessment & Plan Note (Addendum)
stable overall by hx and exam, most recent lab reviewed with pt, and pt to continue medical treatment as before, declines re-check today- for f/u next visit  Lab Results  Component Value Date   LDLCALC 80 11/19/2009

## 2010-08-28 NOTE — Assessment & Plan Note (Signed)
stable overall by hx and exam, most recent lab reviewed with pt, and pt to continue medical treatment as before  BP Readings from Last 3 Encounters:  08/15/10 120/76  11/25/09 124/82  10/04/09 120/72

## 2010-11-08 ENCOUNTER — Other Ambulatory Visit: Payer: Self-pay | Admitting: Internal Medicine

## 2010-11-22 ENCOUNTER — Other Ambulatory Visit (INDEPENDENT_AMBULATORY_CARE_PROVIDER_SITE_OTHER): Payer: Medicare Other

## 2010-11-22 DIAGNOSIS — Z0000 Encounter for general adult medical examination without abnormal findings: Secondary | ICD-10-CM

## 2010-11-22 DIAGNOSIS — R209 Unspecified disturbances of skin sensation: Secondary | ICD-10-CM

## 2010-11-22 DIAGNOSIS — Z79899 Other long term (current) drug therapy: Secondary | ICD-10-CM

## 2010-11-22 DIAGNOSIS — E785 Hyperlipidemia, unspecified: Secondary | ICD-10-CM

## 2010-11-22 DIAGNOSIS — D179 Benign lipomatous neoplasm, unspecified: Secondary | ICD-10-CM

## 2010-11-22 LAB — CBC WITH DIFFERENTIAL/PLATELET
Basophils Absolute: 0 10*3/uL (ref 0.0–0.1)
HCT: 45 % (ref 39.0–52.0)
Lymphs Abs: 1.2 10*3/uL (ref 0.7–4.0)
MCV: 90.7 fl (ref 78.0–100.0)
Monocytes Absolute: 0.8 10*3/uL (ref 0.1–1.0)
Monocytes Relative: 11.6 % (ref 3.0–12.0)
Platelets: 180 10*3/uL (ref 150.0–400.0)
RDW: 13.7 % (ref 11.5–14.6)

## 2010-11-22 LAB — BASIC METABOLIC PANEL
CO2: 28 mEq/L (ref 19–32)
Chloride: 106 mEq/L (ref 96–112)
Glucose, Bld: 88 mg/dL (ref 70–99)
Potassium: 4.8 mEq/L (ref 3.5–5.1)
Sodium: 140 mEq/L (ref 135–145)

## 2010-11-22 LAB — LIPID PANEL
HDL: 69.9 mg/dL (ref >39.00)
Total CHOL/HDL Ratio: 2
VLDL: 8.6 mg/dL (ref 0.0–40.0)

## 2010-11-22 LAB — URINALYSIS, ROUTINE W REFLEX MICROSCOPIC
Hgb urine dipstick: NEGATIVE
Nitrite: NEGATIVE
Specific Gravity, Urine: 1.01 (ref 1.000–1.030)
Total Protein, Urine: NEGATIVE

## 2010-11-22 LAB — TSH: TSH: 1.09 u[IU]/mL (ref 0.35–5.50)

## 2010-11-22 LAB — HEPATIC FUNCTION PANEL: Total Bilirubin: 0.6 mg/dL (ref 0.3–1.2)

## 2010-11-29 ENCOUNTER — Encounter: Payer: Self-pay | Admitting: Internal Medicine

## 2010-11-29 ENCOUNTER — Ambulatory Visit (INDEPENDENT_AMBULATORY_CARE_PROVIDER_SITE_OTHER): Payer: Medicare Other | Admitting: Internal Medicine

## 2010-11-29 VITALS — BP 132/84 | HR 65 | Temp 97.8°F | Ht 67.0 in | Wt 187.0 lb

## 2010-11-29 DIAGNOSIS — M79609 Pain in unspecified limb: Secondary | ICD-10-CM

## 2010-11-29 DIAGNOSIS — R209 Unspecified disturbances of skin sensation: Secondary | ICD-10-CM

## 2010-11-29 DIAGNOSIS — D171 Benign lipomatous neoplasm of skin and subcutaneous tissue of trunk: Secondary | ICD-10-CM | POA: Insufficient documentation

## 2010-11-29 DIAGNOSIS — D179 Benign lipomatous neoplasm, unspecified: Secondary | ICD-10-CM

## 2010-11-29 DIAGNOSIS — Z0000 Encounter for general adult medical examination without abnormal findings: Secondary | ICD-10-CM

## 2010-11-29 DIAGNOSIS — Z23 Encounter for immunization: Secondary | ICD-10-CM

## 2010-11-29 NOTE — Assessment & Plan Note (Addendum)
Better with prendisone, ? Cervical radiculitis -  to f/u any worsening symptoms or concerns, consider ortho eval to further dilineate neck vs right shoulder issues

## 2010-11-29 NOTE — Patient Instructions (Addendum)
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

## 2010-11-29 NOTE — Assessment & Plan Note (Signed)

## 2010-11-29 NOTE — Progress Notes (Signed)
Subjective:    Patient ID: Brandon [name], male    DOB: 08-22-1940, 70 y.o.   MRN: [id number]  HPI Here for wellness and f/u;  Overall doing ok;  Pt denies CP, worsening SOB, DOE, wheezing, orthopnea, PND, worsening LE edema, palpitations, dizziness or syncope.  Pt denies neurological change such as new Headache, facial or extremity weakness.  Pt denies polydipsia, polyuria, or low sugar symptoms. Pt states overall good compliance with treatment and medications, good tolerability, and trying to follow lower cholesterol diet.  Pt denies worsening depressive symptoms, suicidal ideation or panic. No fever, wt loss, night sweats, loss of appetite, or other constitutional symptoms.  Pt states good ability with ADL's, low fall risk, home safety reviewed and adequate, no significant changes in hearing or vision, and occasionally active with exercise.  Does have some right shoulder recurrent discomfort for weeks, better with alleve or rest, worse to lie down at night, but pain also radiates past the elbow and occas seems to have some weakness, comes and goes.   Past Medical History  Diagnosis Date  . GERD 11/15/2006  . HYPERLIPIDEMIA 11/15/2006  . HYPERTENSION 11/15/2006    borderline  . ERECTILE DYSFUNCTION 11/15/2006  . COLONIC POLYPS, HX OF 11/15/2006  . BENIGN PROSTATIC HYPERTROPHY 11/15/2006  . 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 PSA, INCREASED 11/14/2007  . LOW BACK PAIN 11/14/2007    recurrent  . PERIPHERAL VASCULAR DISEASE 11/25/2009    right carotid 60-79% July 2011  . 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 PARESTHESIA, HANDS 11/23/2008   Past Surgical History  Procedure Date  . Inguinal hernia repair 2001  . Prostate biopsy Fall 2009    S/P-neg-Dr. Annabell Howells    reports that he has quit smoking. He does not have any smokeless tobacco history on file. He reports that he drinks alcohol. His drug history not on file. family  history includes Cancer in his mother and other; Depression in his mother; Heart disease in his father; and Stroke in his father. No Known Allergies Current Outpatient Prescriptions on File Prior to Visit  Medication Sig Dispense Refill  . aspirin 81 MG tablet Take 81 mg by mouth daily.        . finasteride (PROSCAR) 5 MG tablet Take 5 mg by mouth daily.        . Multiple Vitamin (MULTIVITAMIN) tablet Take 1 tablet by mouth daily.        Marland Kitchen omeprazole (PRILOSEC) 20 MG capsule Take 20 mg by mouth daily as needed.        . pravastatin (PRAVACHOL) 40 MG tablet TAKE ONE TABLET BY MOUTH EVERY DAY  90 tablet  3  . tadalafil (CIALIS) 20 MG tablet Take 20 mg by mouth daily as needed.        Florian Buff, Borago officinalis, (BORAGE OIL) 1000 MG CAPS Take 1 capsule by mouth daily.        . Cinnamon 500 MG capsule Take 500 mg by mouth daily.        . Flaxseed, Linseed, 1000 MG CAPS Take 1 capsule by mouth daily.        . Garlic 100 MG TABS Take 1 tablet by mouth daily.        . predniSONE (DELTASONE) 10 MG tablet 3 by mouth once daily for 3 days, then 2 by mouth once daily for 3 days, then 1 by mouth once daily for 3 days, then stop  Current Facility-Administered Medications on File Prior to Visit  Medication Dose Route Frequency Provider Last Rate Last Dose  . pneumococcal 23 valent vaccine (PNU-IMMUNE) injection 0.5 mL  0.5 mL Intramuscular Once Oliver Barre, MD       Review of Systems Review of Systems  Constitutional: Negative for diaphoresis, activity change, appetite change and unexpected weight change.  HENT: Negative for hearing loss, ear pain, facial swelling, mouth sores and neck stiffness.   Eyes: Negative for pain, redness and visual disturbance.  Respiratory: Negative for shortness of breath and wheezing.   Cardiovascular: Negative for chest pain and palpitations.  Gastrointestinal: Negative for diarrhea, blood in stool, abdominal distention and rectal pain.  Genitourinary: Negative for  hematuria, flank pain and decreased urine volume.  Musculoskeletal: Negative for myalgias and joint swelling.  Skin: Negative for color change and wound.  Neurological: Negative for syncope and numbness.  Hematological: Negative for adenopathy.  Psychiatric/Behavioral: Negative for hallucinations, self-injury, decreased concentration and agitation.      Objective:   Physical Exam BP 132/84  Pulse 65  Temp(Src) 97.8 F (36.6 C) (Oral)  Ht 5\' 7"  (1.702 m)  Wt 187 lb (84.823 kg)  BMI 29.29 kg/m2  SpO2 96% Physical Exam  VS noted Constitutional: Pt is oriented to person, place, and time. Appears well-developed and well-nourished.  HENT:  Head: Normocephalic and atraumatic.  Right Ear: External ear normal.  Left Ear: External ear normal.  Nose: Nose normal.  Mouth/Throat: Oropharynx is clear and moist.  Eyes: Conjunctivae and EOM are normal. Pupils are equal, round, and reactive to light.  Neck: Normal range of motion. Neck supple. No JVD present. No tracheal deviation present.  Cardiovascular: Normal rate, regular rhythm, normal heart sounds and intact distal pulses.   Pulmonary/Chest: Effort normal and breath sounds normal.  Abdominal: Soft. Bowel sounds are normal. There is no tenderness.  Musculoskeletal: Normal range of motion. Exhibits no edema.  Lymphadenopathy:  Has no cervical adenopathy.  Neurological: Pt is alert and oriented to person, place, and time. Pt has normal reflexes. No cranial nerve deficit. Motor/sens/dtr intact today Skin: Skin is warm and dry. No rash noted.  Psychiatric:  Has  normal mood and affect. Behavior is normal.         Assessment & Plan:   Preventative health care Overall doing well, age appropriate education and counseling updated, referrals for preventative services and immunizations addressed, dietary and smoking counseling addressed, most recent labs and ECG reviewed.  I have personally reviewed and have noted: 1) the patient's medical  and social history 2) The pt's use of alcohol, tobacco, and illicit drugs 3) The patient's current medications and supplements 4) Functional ability including ADL's, fall risk, home safety risk, hearing and visual impairment 5) Diet and physical activities 6) Evidence for depression or mood disorder 7) The patient's height, weight, and BMI have been recorded in the chart I have made referrals, and provided counseling and education based on review of the above

## 2010-11-29 NOTE — Progress Notes (Signed)
Addended by: Scharlene Gloss B on: 11/29/2010 11:39 AM   Modules accepted: Orders

## 2010-11-29 NOTE — Assessment & Plan Note (Signed)
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 Larger - for gen surgury eval

## 2011-01-12 ENCOUNTER — Ambulatory Visit (INDEPENDENT_AMBULATORY_CARE_PROVIDER_SITE_OTHER): Payer: Self-pay | Admitting: General Surgery

## 2011-02-09 ENCOUNTER — Other Ambulatory Visit: Payer: Medicare Other

## 2011-02-09 ENCOUNTER — Encounter (INDEPENDENT_AMBULATORY_CARE_PROVIDER_SITE_OTHER): Payer: Self-pay | Admitting: General Surgery

## 2011-02-09 ENCOUNTER — Ambulatory Visit (INDEPENDENT_AMBULATORY_CARE_PROVIDER_SITE_OTHER): Payer: Medicare Other | Admitting: General Surgery

## 2011-02-09 VITALS — BP 182/98 | HR 80 | Temp 96.2°F | Resp 20 | Ht 60.0 in | Wt 192.0 lb

## 2011-02-09 DIAGNOSIS — D171 Benign lipomatous neoplasm of skin and subcutaneous tissue of trunk: Secondary | ICD-10-CM

## 2011-02-09 DIAGNOSIS — D1779 Benign lipomatous neoplasm of other sites: Secondary | ICD-10-CM

## 2011-02-09 NOTE — Progress Notes (Signed)
Chief Complaint  Patient presents with  . Other    new pt lipoma on upper back     HPI Brandon [name] is a 70 y.o. male.  This patient is referred by Dr. 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 He states that it causes no particular discomfort and is not worry although his wife is worried about the mass and its growth. She states that it has grown over the last 2 years. Again, the patient is currently asymptomatic from this and he denies any family history of sarcoma or skin malignancies although his mother did have lung cancer. He denies any other lumps or bumps. He states that this has not been read or inflamed or any drainage. He has no fevers or chills or weight loss. HPI  Past Medical History  Diagnosis Date  . GERD 11/15/2006  . HYPERLIPIDEMIA 11/15/2006  . HYPERTENSION 11/15/2006    borderline  . ERECTILE DYSFUNCTION 11/15/2006  . COLONIC POLYPS, HX OF 11/15/2006  . BENIGN PROSTATIC HYPERTROPHY 11/15/2006  . 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 PSA, INCREASED 11/14/2007  . LOW BACK PAIN 11/14/2007    recurrent  . PERIPHERAL VASCULAR DISEASE 11/25/2009    right carotid 60-79% July 2011  . 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 PARESTHESIA, HANDS 11/23/2008  . Lipoma 11/29/2010    Past Surgical History  Procedure Date  . Inguinal hernia repair 2001  . Prostate biopsy Fall 2009    S/P-neg-Dr. Annabell Howells    Family History  Problem Relation Age of Onset  . Cancer Mother     Lung Cancer  . Depression Mother   . Heart disease Father   . Stroke Father   . Cancer Other     Colon Cancer-Uncle    Social History History  Substance Use Topics  . Smoking status: Former Games developer  . Smokeless tobacco: Not on file  . Alcohol Use: Yes    No Known Allergies  Current Outpatient Prescriptions  Medication Sig Dispense Refill  . aspirin 81 MG tablet Take 81 mg by mouth daily.         . Cinnamon 500 MG capsule Take 500 mg by mouth daily.        . Cinnamon 500 MG capsule Take 500 mg by mouth 2 (two) times daily.        . finasteride (PROSCAR) 5 MG tablet Take 5 mg by mouth daily.        . Flax Oil-Fish Oil-Borage Oil (FISH OIL-FLAX OIL-BORAGE OIL) CAPS Take by mouth daily.        . Flaxseed, Linseed, 1000 MG CAPS Take 1 capsule by mouth daily.        . Garlic 100 MG TABS Take 1 tablet by mouth daily.        . Garlic 1000 MG CAPS Take by mouth daily.        . Multiple Vitamin (MULTIVITAMIN) tablet Take 1 tablet by mouth daily.        . naproxen sodium (ANAPROX) 220 MG tablet Take 220 mg by mouth at bedtime.        Marland Kitchen omeprazole (PRILOSEC) 20 MG capsule Take 20 mg by mouth daily as needed.        . pravastatin (PRAVACHOL) 40 MG tablet TAKE ONE TABLET BY MOUTH EVERY DAY  90 tablet  3  . predniSONE (DELTASONE) 10 MG tablet 3 by  mouth once daily for 3 days, then 2 by mouth once daily for 3 days, then 1 by mouth once daily for 3 days, then stop       . tadalafil (CIALIS) 20 MG tablet Take 20 mg by mouth daily as needed.         Current Facility-Administered Medications  Medication Dose Route Frequency Provider Last Rate Last Dose  . pneumococcal 23 valent vaccine (PNU-IMMUNE) injection 0.5 mL  0.5 mL Intramuscular Once Oliver Barre, MD        Review of Systems Review of Systems  Musculoskeletal: Positive for myalgias and arthralgias.  All other systems reviewed and are negative.    Blood pressure 182/98, pulse 80, temperature 96.2 F (35.7 C), temperature source Temporal, resp. rate 20, height 5' (1.524 m), weight 192 lb (87.091 kg).  Physical Exam Physical Exam  Vitals reviewed. Constitutional: He is oriented to person, place, and time. He appears well-developed and well-nourished. No distress.  HENT:  Head: Normocephalic and atraumatic.  Mouth/Throat: No oropharyngeal exudate.  Eyes: Conjunctivae are normal. Pupils are equal, round, and reactive to light. Right  eye exhibits no discharge. Left eye exhibits no discharge. No scleral icterus.  Neck: Normal range of motion. Neck supple. No tracheal deviation present.  Cardiovascular: Normal rate, regular rhythm and normal heart sounds.   Pulmonary/Chest: Effort normal and breath sounds normal. No stridor. No respiratory distress. He has no wheezes.  Abdominal: Soft. Bowel sounds are normal. He exhibits no distension and no mass. There is no tenderness. There is no rebound and no guarding.  Musculoskeletal: Normal range of motion.  Neurological: He is alert and oriented to person, place, and time.  Skin: Skin is warm and dry. No rash noted. He is not diaphoretic. No erythema. No pallor.       He has a 10 cm x 10 cm well-defined mass at the base of his neck near the midline. There is no evidence of infection and on exam this is most consistent with a lipoma.  Psychiatric: He has a normal mood and affect. His behavior is normal. Judgment and thought content normal.    Data Reviewed   Assessment    I agree that this is most likely a lipoma and there is no evidence of malignancy. However, it has increased in size over the last few years and is fairly large. He is interested in removal however when we discussed the risks of the procedure, specifically nerve injury, he became concerned and was wondering if there was any way to evaluate the depth of the mass prior to surgery. I recommended MRI for evaluation of the depth and given the size of the lesion I think this is reasonable regardless. If this appears superficial in the muscle and paraspinal tissue is not involved then he would be interested in removal of this lesion. If this appears to be a complicated mass then he states that he may not want removal unless this appears malignant. We discussed the procedure including the risks and alternatives. We discussed the risks of infection, bleeding, pain, scarring, recurrence, nerve injury, and need for repeat surgery.  He expressed understanding of these risks. I think that we could easily remove this mass and relieve his concerns.    Plan    We will check an MRI to see if this is suspicious for malignancy and to evaluate for involvement of surrounding structures. This may effect his decision whether he wants to proceed with excision. He will follow up in  one week after his MRI.       Lodema Pilot DAVID 02/09/2011, 10:47 AM

## 2011-02-17 ENCOUNTER — Encounter (INDEPENDENT_AMBULATORY_CARE_PROVIDER_SITE_OTHER): Payer: Medicare Other | Admitting: General Surgery

## 2011-03-07 ENCOUNTER — Telehealth (INDEPENDENT_AMBULATORY_CARE_PROVIDER_SITE_OTHER): Payer: Self-pay

## 2011-03-07 NOTE — Telephone Encounter (Signed)
Brandon [name] called and would like to schedule surgery, patient has not had an MRI as of 03/07/11.  Please advise on next step for this patient's care.

## 2011-03-09 ENCOUNTER — Other Ambulatory Visit (INDEPENDENT_AMBULATORY_CARE_PROVIDER_SITE_OTHER): Payer: Self-pay | Admitting: General Surgery

## 2011-03-13 ENCOUNTER — Telehealth (INDEPENDENT_AMBULATORY_CARE_PROVIDER_SITE_OTHER): Payer: Self-pay

## 2011-03-13 NOTE — Telephone Encounter (Signed)
Patient called and would like to know the cost of his surgery and if schedulers has his chart.  I reassured patient that his paperwork is with surgery schedulers and he should be receiving a call in the next few day's.

## 2011-03-24 ENCOUNTER — Other Ambulatory Visit: Payer: Self-pay | Admitting: Internal Medicine

## 2011-04-04 ENCOUNTER — Encounter (HOSPITAL_BASED_OUTPATIENT_CLINIC_OR_DEPARTMENT_OTHER): Payer: Self-pay | Admitting: *Deleted

## 2011-04-04 NOTE — Progress Notes (Signed)
No age based cxr or ekg-pt does not need either Arrive with wife am surg

## 2011-04-06 ENCOUNTER — Ambulatory Visit (HOSPITAL_BASED_OUTPATIENT_CLINIC_OR_DEPARTMENT_OTHER): Payer: Medicare Other | Admitting: Certified Registered Nurse Anesthetist

## 2011-04-06 ENCOUNTER — Encounter (HOSPITAL_BASED_OUTPATIENT_CLINIC_OR_DEPARTMENT_OTHER): Payer: Self-pay | Admitting: Certified Registered Nurse Anesthetist

## 2011-04-06 ENCOUNTER — Encounter (HOSPITAL_BASED_OUTPATIENT_CLINIC_OR_DEPARTMENT_OTHER)
Admission: RE | Disposition: A | Discharge status: Home / Self Care | Payer: Self-pay | Source: Ambulatory Visit | Attending: General Surgery

## 2011-04-06 ENCOUNTER — Other Ambulatory Visit (INDEPENDENT_AMBULATORY_CARE_PROVIDER_SITE_OTHER): Payer: Self-pay | Admitting: General Surgery

## 2011-04-06 ENCOUNTER — Other Ambulatory Visit: Payer: Self-pay

## 2011-04-06 ENCOUNTER — Encounter (HOSPITAL_BASED_OUTPATIENT_CLINIC_OR_DEPARTMENT_OTHER): Payer: Self-pay | Admitting: *Deleted

## 2011-04-06 ENCOUNTER — Ambulatory Visit (HOSPITAL_BASED_OUTPATIENT_CLINIC_OR_DEPARTMENT_OTHER)
Admission: RE | Admit: 2011-04-06 | Discharge: 2011-04-06 | Disposition: A | Discharge status: Home / Self Care | Payer: Medicare Other | Source: Ambulatory Visit | Attending: General Surgery | Admitting: General Surgery

## 2011-04-06 DIAGNOSIS — D1779 Benign lipomatous neoplasm of other sites: Secondary | ICD-10-CM

## 2011-04-06 DIAGNOSIS — D1739 Benign lipomatous neoplasm of skin and subcutaneous tissue of other sites: Secondary | ICD-10-CM | POA: Insufficient documentation

## 2011-04-06 DIAGNOSIS — I10 Essential (primary) hypertension: Secondary | ICD-10-CM | POA: Insufficient documentation

## 2011-04-06 HISTORY — PX: MASS EXCISION: SHX2000

## 2011-04-06 SURGERY — EXCISION MASS
Anesthesia: Choice | Site: Back | Laterality: Right | Wound class: Clean

## 2011-04-06 MED ORDER — HYDROMORPHONE HCL PF 1 MG/ML IJ SOLN: 0.2500 mg | INTRAMUSCULAR | Status: DC | PRN

## 2011-04-06 MED ORDER — HYDROCODONE-ACETAMINOPHEN 5-500 MG PO TABS: 1.0000 | ORAL_TABLET | Freq: Four times a day (QID) | ORAL | Status: AC | PRN

## 2011-04-06 MED ORDER — NEOSTIGMINE METHYLSULFATE 1 MG/ML IJ SOLN
INTRAMUSCULAR | Status: DC | PRN
  Administered 2011-04-06: 3 mg via INTRAVENOUS

## 2011-04-06 MED ORDER — EPHEDRINE SULFATE 50 MG/ML IJ SOLN
INTRAMUSCULAR | Status: DC | PRN
  Administered 2011-04-06: 10 mg via INTRAVENOUS

## 2011-04-06 MED ORDER — PROMETHAZINE HCL 25 MG/ML IJ SOLN: 6.2500 mg | INTRAMUSCULAR | Status: DC | PRN

## 2011-04-06 MED ORDER — ONDANSETRON HCL 4 MG/2ML IJ SOLN
INTRAMUSCULAR | Status: DC | PRN
  Administered 2011-04-06: 4 mg via INTRAVENOUS

## 2011-04-06 MED ORDER — DEXAMETHASONE SODIUM PHOSPHATE 4 MG/ML IJ SOLN
INTRAMUSCULAR | Status: DC | PRN
  Administered 2011-04-06: 10 mg via INTRAVENOUS

## 2011-04-06 MED ORDER — LIDOCAINE-EPINEPHRINE (PF) 1 %-1:200000 IJ SOLN
INTRAMUSCULAR | Status: DC | PRN
  Administered 2011-04-06: 10:00:00 via INTRAMUSCULAR

## 2011-04-06 MED ORDER — ROCURONIUM BROMIDE 100 MG/10ML IV SOLN
INTRAVENOUS | Status: DC | PRN
  Administered 2011-04-06: 30 mg via INTRAVENOUS

## 2011-04-06 MED ORDER — GLYCOPYRROLATE 0.2 MG/ML IJ SOLN
INTRAMUSCULAR | Status: DC | PRN
  Administered 2011-04-06: 2 mg via INTRAVENOUS

## 2011-04-06 MED ORDER — CEFAZOLIN SODIUM-DEXTROSE 2-3 GM-% IV SOLR
2.0000 g | INTRAVENOUS | Status: AC
  Administered 2011-04-06: 2 g via INTRAVENOUS

## 2011-04-06 MED ORDER — FENTANYL CITRATE 0.05 MG/ML IJ SOLN
INTRAMUSCULAR | Status: DC | PRN
  Administered 2011-04-06: 50 ug via INTRAVENOUS

## 2011-04-06 MED ORDER — MEPERIDINE HCL 25 MG/ML IJ SOLN: 6.2500 mg | INTRAMUSCULAR | Status: DC | PRN

## 2011-04-06 MED ORDER — LACTATED RINGERS IV SOLN
INTRAVENOUS | Status: DC
  Administered 2011-04-06: 08:00:00 via INTRAVENOUS

## 2011-04-06 MED ORDER — PROPOFOL 10 MG/ML IV EMUL
INTRAVENOUS | Status: DC | PRN
  Administered 2011-04-06: 150 mg via INTRAVENOUS

## 2011-04-06 MED ORDER — LIDOCAINE HCL (CARDIAC) 20 MG/ML IV SOLN
INTRAVENOUS | Status: DC | PRN
  Administered 2011-04-06: 60 mg via INTRAVENOUS

## 2011-04-06 SURGICAL SUPPLY — 37 items
BLADE SURG 15 STRL LF DISP TIS (BLADE) ×1 IMPLANT
BLADE SURG 15 STRL SS (BLADE) ×1
CANISTER SUCTION 1200CC (MISCELLANEOUS) ×2 IMPLANT
CHLORAPREP W/TINT 26ML (MISCELLANEOUS) ×2 IMPLANT
COVER MAYO STAND STRL (DRAPES) ×2 IMPLANT
COVER TABLE BACK 60X90 (DRAPES) ×2 IMPLANT
DECANTER SPIKE VIAL GLASS SM (MISCELLANEOUS) IMPLANT
DRAPE PED LAPAROTOMY (DRAPES) ×2 IMPLANT
DRAPE UTILITY XL STRL (DRAPES) ×2 IMPLANT
ELECT COATED BLADE 2.86 ST (ELECTRODE) ×2 IMPLANT
ELECT REM PT RETURN 9FT ADLT (ELECTROSURGICAL) ×2
ELECTRODE REM PT RTRN 9FT ADLT (ELECTROSURGICAL) ×1 IMPLANT
GAUZE SPONGE 4X4 12PLY STRL LF (GAUZE/BANDAGES/DRESSINGS) ×2 IMPLANT
GLOVE BIO SURGEON STRL SZ7 (GLOVE) ×2 IMPLANT
GLOVE BIO SURGEON STRL SZ7.5 (GLOVE) ×6 IMPLANT
GLOVE BIOGEL PI IND STRL 8 (GLOVE) ×1 IMPLANT
GLOVE BIOGEL PI INDICATOR 8 (GLOVE) ×1
GLOVE INDICATOR 7.0 STRL GRN (GLOVE) ×2 IMPLANT
GOWN PREVENTION PLUS XLARGE (GOWN DISPOSABLE) ×4 IMPLANT
GOWN PREVENTION PLUS XXLARGE (GOWN DISPOSABLE) IMPLANT
NEEDLE HYPO 22GX1.5 SAFETY (NEEDLE) ×2 IMPLANT
NS IRRIG 1000ML POUR BTL (IV SOLUTION) ×2 IMPLANT
PACK BASIN DAY SURGERY FS (CUSTOM PROCEDURE TRAY) ×2 IMPLANT
PENCIL BUTTON HOLSTER BLD 10FT (ELECTRODE) ×2 IMPLANT
SLEEVE SCD COMPRESS KNEE MED (MISCELLANEOUS) ×2 IMPLANT
SPONGE LAP 4X18 X RAY DECT (DISPOSABLE) ×2 IMPLANT
SUT ETHILON 2 0 FS 18 (SUTURE) ×2 IMPLANT
SUT MNCRL AB 4-0 PS2 18 (SUTURE) ×2 IMPLANT
SUT SILK 2 0 SH (SUTURE) ×2 IMPLANT
SUT VIC AB 0 SH 27 (SUTURE) ×2 IMPLANT
SUT VICRYL 3-0 CR8 SH (SUTURE) ×2 IMPLANT
SYR CONTROL 10ML LL (SYRINGE) ×2 IMPLANT
TOWEL OR 17X24 6PK STRL BLUE (TOWEL DISPOSABLE) ×2 IMPLANT
TOWEL OR NON WOVEN STRL DISP B (DISPOSABLE) ×2 IMPLANT
TUBE CONNECTING 20X1/4 (TUBING) ×2 IMPLANT
WATER STERILE IRR 1000ML POUR (IV SOLUTION) IMPLANT
YANKAUER SUCT BULB TIP NO VENT (SUCTIONS) ×2 IMPLANT

## 2011-04-06 NOTE — Anesthesia Procedure Notes (Signed)
Procedure Name: Intubation Date/Time: 04/06/2011 9:33 AM Performed by: Zafar Debrosse D Pre-anesthesia Checklist: Patient identified, Emergency Drugs available, Suction available, Patient being monitored and Timeout performed Patient Re-evaluated:Patient Re-evaluated prior to inductionOxygen Delivery Method: Circle System Utilized Preoxygenation: Pre-oxygenation with 100% oxygen Intubation Type: IV induction Ventilation: Mask ventilation without difficulty Laryngoscope Size: Mac and 4 Grade View: Grade I Tube type: Oral Tube size: 7.0 mm Number of attempts: 1 Airway Equipment and Method: stylet and oral airway Placement Confirmation: ETT inserted through vocal cords under direct vision,  positive ETCO2 and breath sounds checked- equal and bilateral Secured at: 21 cm Tube secured with: Tape Dental Injury: Teeth and Oropharynx as per pre-operative assessment

## 2011-04-06 NOTE — Transfer of Care (Signed)
Immediate Anesthesia Transfer of Care Note  Patient: Brandon [name]  Procedure(s) Performed:  EXCISION MASS - excision right  upper back mass  Patient Location: PACU  Anesthesia Type: General  Level of Consciousness: awake, alert , oriented and patient cooperative  Airway & Oxygen Therapy: Patient Spontanous Breathing and Patient connected to face mask oxygen  Post-op Assessment: Report given to PACU RN, Post -op Vital signs reviewed and stable and Patient moving all extremities X 4  Post vital signs: Reviewed and stable  Complications: No apparent anesthesia complications

## 2011-04-06 NOTE — Anesthesia Preprocedure Evaluation (Addendum)
Anesthesia Evaluation  Patient identified by MRN, date of birth, ID band Patient awake    Reviewed: Allergy & Precautions, H&P , NPO status , Patient's Chart, lab work & pertinent test results  Airway Mallampati: III TM Distance: >3 FB Neck ROM: full    Dental No notable dental hx. (+) Teeth Intact   Pulmonary neg pulmonary ROS,  clear to auscultation  Pulmonary exam normal       Cardiovascular hypertension, On Medications neg cardio ROS regular Normal    Neuro/Psych Negative Neurological ROS  Negative Psych ROS   GI/Hepatic Neg liver ROS, Medicated and Controlled,  Endo/Other  Negative Endocrine ROS  Renal/GU negative Renal ROS  Genitourinary negative   Musculoskeletal   Abdominal Normal abdominal exam  (+)   Peds  Hematology negative hematology ROS (+)   Anesthesia Other Findings   Reproductive/Obstetrics negative OB ROS                           Anesthesia Physical Anesthesia Plan  ASA: III  Anesthesia Plan: General   Post-op Pain Management:    Induction: Intravenous  Airway Management Planned: Oral ETT  Additional Equipment:   Intra-op Plan:   Post-operative Plan: Extubation in OR  Informed Consent: I have reviewed the patients History and Physical, chart, labs and discussed the procedure including the risks, benefits and alternatives for the proposed anesthesia with the patient or authorized representative who has indicated his/her understanding and acceptance.     Plan Discussed with: CRNA  Anesthesia Plan Comments:         Anesthesia Quick Evaluation

## 2011-04-06 NOTE — Anesthesia Postprocedure Evaluation (Signed)
  Anesthesia Post-op Note  Patient: Brandon [name]  Procedure(s) Performed:  EXCISION MASS - excision right  upper back mass  Patient Location: PACU  Anesthesia Type: General  Level of Consciousness: awake, alert  and oriented  Airway and Oxygen Therapy: Patient Spontanous Breathing  Post-op Pain: mild  Post-op Assessment: Post-op Vital signs reviewed, Patient's Cardiovascular Status Stable, Respiratory Function Stable, Patent Airway and No signs of Nausea or vomiting  Post-op Vital Signs: Reviewed and stable  Complications: No apparent anesthesia complications

## 2011-04-06 NOTE — Brief Op Note (Signed)
04/06/2011  10:52 AM  PATIENT:  Brandon [name]  70 y.o. male  PRE-OPERATIVE DIAGNOSIS:  upper right back mass  POST-OPERATIVE DIAGNOSIS:  upper right back mass  PROCEDURE:  Procedure(s): EXCISION MASS  SURGEON:  Surgeon(s): Rulon Abide, DO  PHYSICIAN ASSISTANT:   ASSISTANTS: none   ANESTHESIA:   general  EBL:  Total I/O In: 1100 [I.V.:1100] Out: -   BLOOD ADMINISTERED:none  DRAINS: none   LOCAL MEDICATIONS USED:  MARCAINE 20CC and LIDOCAINE 20CC  SPECIMEN:  Source of Specimen:  upper back mass  DISPOSITION OF SPECIMEN:  PATHOLOGY  COUNTS:  YES  TOURNIQUET:  * No tourniquets in log *  DICTATION: .Other Dictation: Dictation Number [id number]  PLAN OF CARE: Discharge to home after PACU  PATIENT DISPOSITION:  PACU - hemodynamically stable.   Delay start of Pharmacological VTE agent (>24hrs) due to surgical blood loss or risk of bleeding:  {YES/NO/NOT APPLICABLE:20182

## 2011-04-06 NOTE — H&P (Signed)
Brandon [name] is a 70 y.o. male. This patient is referred by Dr. 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 He states that it causes no particular discomfort and is not worry although his wife is worried about the mass and its growth. She states that it has grown over the last 2 years. Again, the patient is currently asymptomatic from this and he denies any family history of sarcoma or skin malignancies although his mother did have lung cancer. He denies any other lumps or bumps. He states that this has not been read or inflamed or any drainage. He has no fevers or chills or weight loss.   This H/P was reviewed with the patient and he denies any changes other than his BP has been more elevated. HPI  Past Medical History   Diagnosis  Date   .  GERD  11/15/2006   .  HYPERLIPIDEMIA  11/15/2006   .  HYPERTENSION  11/15/2006     borderline   .  ERECTILE DYSFUNCTION  11/15/2006   .  COLONIC POLYPS, HX OF  11/15/2006   .  BENIGN PROSTATIC HYPERTROPHY  11/15/2006   .  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  PSA, INCREASED  11/14/2007   .  LOW BACK PAIN  11/14/2007     recurrent   .  PERIPHERAL VASCULAR DISEASE  11/25/2009     right carotid 60-79% July 2011   .  CAROTID ARTERY STENOSIS, BILATERAL  11/14/2007   .  PARESTHESIA, HANDS  11/23/2008   .  Lipoma  11/29/2010    Past Surgical History   Procedure  Date   .  Inguinal hernia repair  2001   .  Prostate biopsy  Fall 2009     S/P-neg-Dr. Annabell Howells    Family History   Problem  Relation  Age of Onset   .  Cancer  Mother       Lung Cancer    .  Depression  Mother    .  Heart disease  Father    .  Stroke  Father    .  Cancer  Other       Colon Cancer-Uncle    Social History  History   Substance Use Topics   .  Smoking status:  Former Games developer   .  Smokeless tobacco:  Not on file   .  Alcohol Use:  Yes    No Known Allergies  Current  Outpatient Prescriptions   Medication  Sig  Dispense  Refill   .  aspirin 81 MG tablet  Take 81 mg by mouth daily.     .  Cinnamon 500 MG capsule  Take 500 mg by mouth daily.     .  Cinnamon 500 MG capsule  Take 500 mg by mouth 2 (two) times daily.     .  finasteride (PROSCAR) 5 MG tablet  Take 5 mg by mouth daily.     .  Flax Oil-Fish Oil-Borage Oil (FISH OIL-FLAX OIL-BORAGE OIL) CAPS  Take by mouth daily.     .  Flaxseed, Linseed, 1000 MG CAPS  Take 1 capsule by mouth daily.     .  Garlic 100 MG TABS  Take 1 tablet by mouth daily.     .  Garlic 1000 MG CAPS  Take by mouth daily.     .  Multiple Vitamin (  MULTIVITAMIN) tablet  Take 1 tablet by mouth daily.     .  naproxen sodium (ANAPROX) 220 MG tablet  Take 220 mg by mouth at bedtime.     Marland Kitchen  omeprazole (PRILOSEC) 20 MG capsule  Take 20 mg by mouth daily as needed.     .  pravastatin (PRAVACHOL) 40 MG tablet  TAKE ONE TABLET BY MOUTH EVERY DAY  90 tablet  3   .  predniSONE (DELTASONE) 10 MG tablet  3 by mouth once daily for 3 days, then 2 by mouth once daily for 3 days, then 1 by mouth once daily for 3 days, then stop     .  tadalafil (CIALIS) 20 MG tablet  Take 20 mg by mouth daily as needed.      Current Facility-Administered Medications   Medication  Dose  Route  Frequency  Provider  Last Rate  Last Dose   .  pneumococcal 23 valent vaccine (PNU-IMMUNE) injection 0.5 mL  0.5 mL  Intramuscular  Once  Oliver Barre, MD      Review of Systems  Review of Systems  Musculoskeletal: Positive for myalgias and arthralgias.  All other systems reviewed and are negative.   Physical Exam  Physical Exam  Vitals reviewed.  Constitutional: He is oriented to person, place, and time. He appears well-developed and well-nourished. No distress.  HENT:  Head: Normocephalic and atraumatic.  Mouth/Throat: No oropharyngeal exudate.  Eyes: Conjunctivae are normal. Pupils are equal, round, and reactive to light. Right eye exhibits no discharge. Left eye  exhibits no discharge. No scleral icterus.  Neck: Normal range of motion. Neck supple. No tracheal deviation present.  Cardiovascular: Normal rate, regular rhythm and normal heart sounds.  Pulmonary/Chest: Effort normal and breath sounds normal. No stridor. No respiratory distress. He has no wheezes.  Abdominal: Soft. Bowel sounds are normal. He exhibits no distension and no mass. There is no tenderness. There is no rebound and no guarding.  Musculoskeletal: Normal range of motion.  Neurological: He is alert and oriented to person, place, and time.  Skin: Skin is warm and dry. No rash noted. He is not diaphoretic. No erythema. No pallor.  He has a 10 cm x 10 cm well-defined mass at the base of his neck near the midline. There is no evidence of infection and on exam this is most consistent with a lipoma.  Psychiatric: He has a normal mood and affect. His behavior is normal. Judgment and thought content normal.   Data Reviewed  Assessment   I agree that this is most likely a lipoma and there is no evidence of malignancy. However, it has increased in size over the last few years and is fairly large. He is interested in removal however when we discussed the risks of the procedure, specifically nerve injury, he became concerned and was wondering if there was any way to evaluate the depth of the mass prior to surgery. I recommended MRI for evaluation of the depth and given the size of the lesion I think this is reasonable regardless. If this appears superficial in the muscle and paraspinal tissue is not involved then he would be interested in removal of this lesion. If this appears to be a complicated mass then he states that he may not want removal unless this appears malignant. We discussed the procedure including the risks and alternatives. We discussed the risks of infection, bleeding, pain, scarring, recurrence, nerve injury, and need for repeat surgery. He expressed understanding of these risks.  I  think that we could easily remove this mass and relieve his concerns.  Plan: He declined the MRI after I had put in the order for this.  He has decided to proceed with excision of the mass.  History reviewed, patient examined, no change in status, stable for surgery. We again discussed the risks of the procedure including infection, bleeding, pain, scarring, recurrence, and nerve injury to nerves such as the spinal accessory nerve.  He expressed understanding and desires to proceed. Site marked.  Lodema Pilot DAVID 04/06/2011 9:15 AM

## 2011-04-07 ENCOUNTER — Encounter (HOSPITAL_BASED_OUTPATIENT_CLINIC_OR_DEPARTMENT_OTHER): Payer: Self-pay | Admitting: General Surgery

## 2011-04-07 NOTE — Op Note (Signed)
Brandon [name], Brandon [name] NO.:  [id number]  MEDICAL RECORD NO.:  [id number]  LOCATION:                                 FACILITY:  PHYSICIAN:  Lodema Pilot, MD            DATE OF BIRTH:  DATE OF PROCEDURE:  04/06/2011 DATE OF DISCHARGE:                              OPERATIVE REPORT   PROCEDURE:  Excision of upper back mass.  PREOPERATIVE DIAGNOSIS:  Back mass.  POSTOPERATIVE DIAGNOSIS:  Back mass.  SURGEON:  Lodema Pilot, MD  ASSISTANT:  None.  ANESTHESIA:  General endotracheal anesthesia and 40 mL of 1% lidocaine with epinephrine.  Marcaine 0.25% in a 50:50 mixture.  FLUIDS:  1200 mL crystalloid.  ESTIMATED BLOOD LOSS:  Minimal.  DRAINS:  None.  SPECIMENS:  Upper back mass sent to Pathology for permanent sectioning. Measuring approximately 6 cm x 6 cm.  COMPLICATIONS:  None apparent.  FINDINGS:  A 6 cm x 6 cm upper back mass lipomatous appearing attached to the surrounding structures but superficial to the muscle.  INDICATION FOR PROCEDURE:  [name] is a 70 year old male with enlarging upper back mass who desires excision and on exam had a 10 cm bulge in the upper mid back.  He was asymptomatic but again the mass was enlarging and is concerned about possible malignancy.  OPERATIVE DETAILS:  Mr. Malmstrom was seen and evaluated in the preoperative area and risks and benefits of the procedure were again discussed in lay terms.  Informed consent was obtained.  Surgical site was marked prior to anesthetic administration.  The risks of the procedure were again discussed in lay terms including nerve injury and recurrence and hematoma and infection and poor cosmesis any he expressed understanding and he desired to proceed with planned procedure. Prophylactic antibiotics were given and he was taken to the operating room and general endotracheal anesthesia was obtained.  Then he was put on table in a prone position and the area around the mass was  prepped and draped in a standard surgical fashion.  Procedure time-out was performed with all operative team members to confirm proper patient, procedure, and a transverse incision was made over the mass and dissection carried down into the lipomatous-appearing tissue using Bovie electrocautery.  He did not have a well-defined lipoma and the mass did not shell out easily.  Subcutaneous flaps were raised around the mass circumferentially and dissection carried down to the fascia using Bovie electrocautery.  Then the DMS was elevated and undermined staying superficial to the fascia to avoid injury to nerves such as a spinal accessory nerve.  I did not see any significant neurovascular structures during the dissection and again the mass appeared to be to remain superficial to the fascia and the muscle was not entered.  However, the mass was not easily enucleated and had multiple septations and attachments circumferentially, carefully dissected around the perimeter of the mass using Bovie electrocautery and blunt dissection and eventually was able to roll the mass up from medial to lateral direction and then again staying superficial to the fascia, the mass was undermined and amputated.  The lesion was removed in one entire mass measuring approximately 6 cm  x 6 cm and sent to pathology for permanent sectioning.  The wound was then inspected for hemostasis which was obtained with Bovie electrocautery and the wound was irrigated with sterile saline solution and was injected with 1% lidocaine with epinephrine and 0.25% Marcaine in a 50:50 mixture for a total of 40 mL. Again, the wound was noted to be hemostatic and 2-0 Vicryl sutures were used in interrupted fashion to approximate the dermis and attempted to close down the empty space and skin edges were approximated with interrupted 2-0 nylon vertical mattress sutures.  The skin was washed and dried and sterile pressure dressing was applied.   All sponge, needle, instrument counts were correct in the case.  The patient tolerated the procedure well without apparent complication.          ______________________________ Lodema Pilot, MD     BL/MEDQ  D:  04/06/2011  T:  04/06/2011  Job:  [id number]

## 2011-04-14 ENCOUNTER — Encounter (INDEPENDENT_AMBULATORY_CARE_PROVIDER_SITE_OTHER): Payer: Medicare Other | Admitting: General Surgery

## 2011-04-20 ENCOUNTER — Encounter (INDEPENDENT_AMBULATORY_CARE_PROVIDER_SITE_OTHER): Payer: Self-pay | Admitting: General Surgery

## 2011-04-20 ENCOUNTER — Ambulatory Visit (INDEPENDENT_AMBULATORY_CARE_PROVIDER_SITE_OTHER): Payer: Medicare Other | Admitting: General Surgery

## 2011-04-20 VITALS — BP 146/88 | HR 70 | Temp 96.9°F | Resp 16 | Ht 68.0 in | Wt 191.8 lb

## 2011-04-20 DIAGNOSIS — Z4889 Encounter for other specified surgical aftercare: Secondary | ICD-10-CM

## 2011-04-20 DIAGNOSIS — Z5189 Encounter for other specified aftercare: Secondary | ICD-10-CM

## 2011-04-20 NOTE — Progress Notes (Signed)
Subjective:     Patient ID: Brandon [name], male   DOB: [birth year], 71 y.o.   MRN: [id number]  HPI This patient follows up today status post excision of lipoma from upper back. He's doing well without complaints. He is here for suture removal. His pathology was benign.  Review of Systems     Objective:   Physical Exam No acute distress and nontoxic-appearing  His incision is healing well without sign of infection. The edges are well approximated and the sutures were removed today in Steri-Strips are applied. He does have some fluid accumulation or seroma underneath the incision but there is no evidence of infection.    Assessment:     Status post excision of back lipoma-doing well Pathology was benign    Plan:     Sutures were removed and he can followup [name] p.r.n. basis. Increase activity as tolerated.

## 2011-05-04 ENCOUNTER — Encounter (INDEPENDENT_AMBULATORY_CARE_PROVIDER_SITE_OTHER): Payer: Medicare Other | Admitting: General Surgery

## 2011-05-08 ENCOUNTER — Ambulatory Visit (INDEPENDENT_AMBULATORY_CARE_PROVIDER_SITE_OTHER): Payer: Medicare Other | Admitting: Internal Medicine

## 2011-05-08 ENCOUNTER — Encounter: Payer: Self-pay | Admitting: Internal Medicine

## 2011-05-08 VITALS — BP 152/100 | HR 67 | Temp 98.4°F | Ht 68.0 in | Wt 196.2 lb

## 2011-05-08 DIAGNOSIS — M545 Low back pain: Secondary | ICD-10-CM

## 2011-05-08 DIAGNOSIS — M19049 Primary osteoarthritis, unspecified hand: Secondary | ICD-10-CM

## 2011-05-08 DIAGNOSIS — I10 Essential (primary) hypertension: Secondary | ICD-10-CM

## 2011-05-08 DIAGNOSIS — E785 Hyperlipidemia, unspecified: Secondary | ICD-10-CM

## 2011-05-08 DIAGNOSIS — I6529 Occlusion and stenosis of unspecified carotid artery: Secondary | ICD-10-CM

## 2011-05-08 HISTORY — DX: Primary osteoarthritis, unspecified hand: M19.049

## 2011-05-08 MED ORDER — AMLODIPINE BESYLATE 5 MG PO TABS: 5.0000 mg | ORAL_TABLET | Freq: Every day | ORAL | Status: DC

## 2011-05-08 MED ORDER — CYCLOBENZAPRINE HCL 5 MG PO TABS: 5.0000 mg | ORAL_TABLET | Freq: Three times a day (TID) | ORAL | Status: AC | PRN

## 2011-05-08 NOTE — Assessment & Plan Note (Signed)
Mult typical finger bony arthritic changes, mild, for tylenol prn

## 2011-05-08 NOTE — Assessment & Plan Note (Signed)
stable overall by hx and exam, most recent data reviewed with pt, and pt to continue medical treatment as before  Lab Results  Component Value Date   LDLCALC 90 11/22/2010

## 2011-05-08 NOTE — Assessment & Plan Note (Addendum)
Asympt, due for f/u;  For carotid u/s, Continue all other medications as before

## 2011-05-08 NOTE — Patient Instructions (Addendum)
Take all new medications as prescribed - the generic norvasc for blood pressure Continue all other medications as before You will be contacted regarding the referral for: renal artery ultrasound, and the Carotid Doppler ultrasound Please focus as you do on being more active, low cholesterol diet, weight loss

## 2011-05-08 NOTE — Progress Notes (Signed)
Subjective:    Patient ID: Brandon [name], male    DOB: Feb 20, 1941, 71 y.o.   MRN: [id number]  HPI  Here to f/u; overall doing ok,  Pt denies chest pain, increased sob or doe, wheezing, orthopnea, PND, increased LE swelling, palpitations, dizziness or syncope.  Pt denies new neurological symptoms such as new headache, or facial or extremity weakness or numbness   Pt denies polydipsia, polyuria.  Pt states overall good compliance with meds, trying to follow lower cholesterol diet, but wt overall increased approx 10 lbs, has known carotid dz and due for f/u.  BP has been noted elevated on mult occasions with other providers and at home.  Pt  Also with incidental mild left LBP without change in severity, bowel or bladder change, fever, wt loss,  worsening LE pain/numbness/weakness, gait change or falls, mild for 2 days.  Also with multiple finger joints in the AM stiff but no specific swelling, hot water does not help, has some mild stiffness with extendning the fingers fully in the am.   Pt denies fever, wt loss, night sweats, loss of appetite, or other constitutional symptoms  No recent trauma. Past Medical History  Diagnosis Date  . GERD 11/15/2006  . HYPERLIPIDEMIA 11/15/2006  . ERECTILE DYSFUNCTION 11/15/2006  . COLONIC POLYPS, HX OF 11/15/2006  . BENIGN PROSTATIC HYPERTROPHY 11/15/2006  . 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 PSA, INCREASED 11/14/2007  . LOW BACK PAIN 11/14/2007    recurrent  . PERIPHERAL VASCULAR DISEASE 11/25/2009    right carotid 60-79% July 2011  . 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 PARESTHESIA, HANDS 11/23/2008  . Lipoma 11/29/2010  . HYPERTENSION 11/15/2006    borderline  . 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 Inguinal hernia repair 2001  . Prostate biopsy Fall 2009    S/P-neg-Dr. Annabell Howells  . Colonoscopy   . Mass excision 04/06/2011    Procedure:  EXCISION MASS;  Surgeon: Rulon Abide, DO;  Location: [facility];  Service: General;  Laterality: Right;  excision right  upper back mass    reports that he quit smoking about 51 years ago. He has never used smokeless tobacco. He reports that he drinks alcohol. He reports that he does not use illicit drugs. family history includes Cancer in his mother and other; Depression in his mother; Heart disease in his father; and Stroke in his father. No Known Allergies Current Outpatient Prescriptions on File Prior to Visit  Medication Sig Dispense Refill  . aspirin 81 MG tablet Take 81 mg by mouth daily.        . Cinnamon 500 MG capsule Take 500 mg by mouth 2 (two) times daily.        . finasteride (PROSCAR) 5 MG tablet Take 5 mg by mouth daily.        . Flaxseed, Linseed, 1000 MG CAPS Take 1 capsule by mouth daily.        . Garlic 1000 MG CAPS Take by mouth daily.        . Multiple Vitamin (MULTIVITAMIN) tablet Take 1 tablet by mouth daily.        . naproxen sodium (ANAPROX) 220 MG tablet Take 220 mg by mouth at bedtime.        Marland Kitchen omeprazole (PRILOSEC) 20 MG capsule TAKE ONE CAPSULE BY MOUTH EVERY DAY AS NEEDED  90 capsule  3  . pravastatin (  PRAVACHOL) 40 MG tablet TAKE ONE TABLET BY MOUTH EVERY DAY  90 tablet  3  . predniSONE (DELTASONE) 10 MG tablet 3 by mouth once daily for 3 days, then 2 by mouth once daily for 3 days, then 1 by mouth once daily for 3 days, then stop       . tadalafil (CIALIS) 20 MG tablet Take 20 mg by mouth daily as needed.        . Flax Oil-Fish Oil-Borage Oil (FISH OIL-FLAX OIL-BORAGE OIL) CAPS Take by mouth daily.         Current Facility-Administered Medications on File Prior to Visit  Medication Dose Route Frequency Provider Last Rate Last Dose  . pneumococcal 23 valent vaccine (PNU-IMMUNE) injection 0.5 mL  0.5 mL Intramuscular Once Oliver Barre, MD       Review of Systems Review of Systems  Constitutional: Negative for diaphoresis and unexpected  weight change.  HENT: Negative for drooling and tinnitus.   Eyes: Negative for photophobia and visual disturbance.  Respiratory: Negative for choking and stridor.   Gastrointestinal: Negative for vomiting and blood in stool.  Genitourinary: Negative for hematuria and decreased urine volume.  Musculoskeletal: Negative for gait problem.  Skin: Negative for color change and wound.  Neurological: Negative for tremors and numbness.  Psychiatric/Behavioral: Negative for decreased concentration. The patient is not hyperactive.       Objective:   Physical Exam BP 152/100  Pulse 67  Temp(Src) 98.4 F (36.9 C) (Oral)  Ht 5\' 8"  (1.727 m)  Wt 196 lb 4 oz (89.018 kg)  BMI 29.84 kg/m2  SpO2 96% Physical Exam  VS noted Constitutional: Pt appears well-developed and well-nourished.  HENT: Head: Normocephalic.  Right Ear: External ear normal.  Left Ear: External ear normal.  Eyes: Conjunctivae and EOM are normal. Pupils are equal, round, and reactive to light.  Neck: Normal range of motion. Neck supple.  Cardiovascular: Normal rate and regular rhythm.   Pulmonary/Chest: Effort normal and breath sounds normal.  Abd:  Soft, NT, non-distended, + BS Neurological: Pt is alert. No cranial nerve deficit. motor/dtr/gait intact Skin: Skin is warm. No erythema.  Spien nontender, no paravertebral tender; hands with minor bony OA changes to several DIP/PIP, no overt synovitis, has FROM, NT Psychiatric: Pt behavior is normal. Thought content normal.     Assessment & Plan:

## 2011-05-08 NOTE — Assessment & Plan Note (Signed)
C/w msk spasm - for flexeril prn,  to f/u any worsening symptoms or concerns

## 2011-05-08 NOTE — Assessment & Plan Note (Addendum)
uncontrolled overall by hx and exam, most recent data reviewed with pt, and pt to continue medical treatment with new amlodipine 5 qd, cont wt loss efforts, low salt diet; also r/o RAS with renal art u/s, though wt increase recent may account for BP increase  BP Readings from Last 3 Encounters:  05/08/11 152/100  04/20/11 146/88  04/06/11 148/81

## 2011-05-18 ENCOUNTER — Other Ambulatory Visit: Payer: Self-pay | Admitting: Cardiology

## 2011-05-18 DIAGNOSIS — I6529 Occlusion and stenosis of unspecified carotid artery: Secondary | ICD-10-CM

## 2011-05-22 ENCOUNTER — Encounter (INDEPENDENT_AMBULATORY_CARE_PROVIDER_SITE_OTHER): Payer: Medicare Other | Admitting: Cardiology

## 2011-05-22 ENCOUNTER — Encounter: Payer: Medicare Other | Admitting: Cardiology

## 2011-05-22 DIAGNOSIS — R209 Unspecified disturbances of skin sensation: Secondary | ICD-10-CM

## 2011-05-22 DIAGNOSIS — I6529 Occlusion and stenosis of unspecified carotid artery: Secondary | ICD-10-CM

## 2011-05-25 ENCOUNTER — Other Ambulatory Visit: Payer: Self-pay | Admitting: Cardiology

## 2011-05-25 ENCOUNTER — Ambulatory Visit (INDEPENDENT_AMBULATORY_CARE_PROVIDER_SITE_OTHER): Payer: Medicare Other | Admitting: Cardiology

## 2011-05-25 DIAGNOSIS — I10 Essential (primary) hypertension: Secondary | ICD-10-CM

## 2011-11-23 ENCOUNTER — Other Ambulatory Visit: Payer: Medicare Other

## 2011-11-30 ENCOUNTER — Encounter: Payer: Medicare Other | Admitting: Internal Medicine

## 2011-11-30 ENCOUNTER — Other Ambulatory Visit (INDEPENDENT_AMBULATORY_CARE_PROVIDER_SITE_OTHER): Payer: Medicare Other

## 2011-11-30 DIAGNOSIS — Z0000 Encounter for general adult medical examination without abnormal findings: Secondary | ICD-10-CM

## 2011-11-30 DIAGNOSIS — Z79899 Other long term (current) drug therapy: Secondary | ICD-10-CM

## 2011-11-30 LAB — TSH: TSH: 0.67 u[IU]/mL (ref 0.35–5.50)

## 2011-11-30 LAB — URINALYSIS, ROUTINE W REFLEX MICROSCOPIC
Nitrite: NEGATIVE
Total Protein, Urine: NEGATIVE
Urine Glucose: NEGATIVE
pH: 6 (ref 5.0–8.0)

## 2011-11-30 LAB — CBC WITH DIFFERENTIAL/PLATELET
Basophils Absolute: 0 10*3/uL (ref 0.0–0.1)
Lymphocytes Relative: 24.9 % (ref 12.0–46.0)
Monocytes Relative: 7.6 % (ref 3.0–12.0)
Neutrophils Relative %: 63.4 % (ref 43.0–77.0)
Platelets: 224 10*3/uL (ref 150.0–400.0)
RDW: 14.1 % (ref 11.5–14.6)
WBC: 5.5 10*3/uL (ref 4.5–10.5)

## 2011-11-30 LAB — LIPID PANEL
HDL: 62.9 mg/dL (ref >39.00)
Total CHOL/HDL Ratio: 2

## 2011-11-30 LAB — HEPATIC FUNCTION PANEL
AST: 19 U/L (ref 0–37)
Alkaline Phosphatase: 52 U/L (ref 39–117)
Bilirubin, Direct: 0.1 mg/dL (ref 0.0–0.3)
Total Bilirubin: 0.7 mg/dL (ref 0.3–1.2)

## 2011-11-30 LAB — BASIC METABOLIC PANEL
BUN: 19 mg/dL (ref 6–23)
CO2: 27 mEq/L (ref 19–32)
Calcium: 9.5 mg/dL (ref 8.4–10.5)
Creatinine, Ser: 0.9 mg/dL (ref 0.4–1.5)
Glucose, Bld: 89 mg/dL (ref 70–99)
Sodium: 140 mEq/L (ref 135–145)

## 2011-12-05 ENCOUNTER — Other Ambulatory Visit: Payer: Self-pay | Admitting: Internal Medicine

## 2011-12-07 ENCOUNTER — Ambulatory Visit (INDEPENDENT_AMBULATORY_CARE_PROVIDER_SITE_OTHER): Payer: Medicare Other | Admitting: Internal Medicine

## 2011-12-07 ENCOUNTER — Encounter: Payer: Self-pay | Admitting: Internal Medicine

## 2011-12-07 VITALS — BP 140/88 | HR 64 | Temp 97.6°F | Ht 68.0 in | Wt 193.2 lb

## 2011-12-07 DIAGNOSIS — Z0000 Encounter for general adult medical examination without abnormal findings: Secondary | ICD-10-CM

## 2011-12-07 NOTE — Assessment & Plan Note (Signed)

## 2011-12-07 NOTE — Progress Notes (Signed)
Subjective:    Patient ID: Brandon [name], male    DOB: 1940-12-31, 71 y.o.   MRN: [id number]  HPI  Here for wellness and f/u;  Overall doing ok;  Pt denies CP, worsening SOB, DOE, wheezing, orthopnea, PND, worsening LE edema, palpitations, dizziness or syncope.  Pt denies neurological change such as new Headache, facial or extremity weakness.  Pt denies polydipsia, polyuria, or low sugar symptoms. Pt states overall good compliance with treatment and medications, good tolerability, and trying to follow lower cholesterol diet.  Pt denies worsening depressive symptoms, suicidal ideation or panic. No fever, wt loss, night sweats, loss of appetite, or other constitutional symptoms.  Pt states good ability with ADL's, low fall risk, home safety reviewed and adequate, no significant changes in hearing or vision, and occasionally active with exercise.  Does wonder about low testosterone now that he is taking the finasteride, will review with Dr Earlene Plater.  Has recurrent neck stiffness and pain sometimes mod to severe after "sleeping wrong" the night before.  Also tends to do fairly strenous yard work and will get right buttock pain, has hx of lower back issues that respond to ibuprofen.   No other acute complaints Past Medical History  Diagnosis Date  . GERD 11/15/2006  . HYPERLIPIDEMIA 11/15/2006  . ERECTILE DYSFUNCTION 11/15/2006  . COLONIC POLYPS, HX OF 11/15/2006  . BENIGN PROSTATIC HYPERTROPHY 11/15/2006  . 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 PSA, INCREASED 11/14/2007  . LOW BACK PAIN 11/14/2007    recurrent  . PERIPHERAL VASCULAR DISEASE 11/25/2009    right carotid 60-79% July 2011  . 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 PARESTHESIA, HANDS 11/23/2008  . Lipoma 11/29/2010  . HYPERTENSION 11/15/2006    borderline  . 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 Inguinal hernia repair 2001  .  Prostate biopsy Fall 2009    S/P-neg-Dr. Annabell Howells  . Colonoscopy   . Mass excision 04/06/2011    Procedure: EXCISION MASS;  Surgeon: Rulon Abide, DO;  Location: [facility];  Service: General;  Laterality: Right;  excision right  upper back mass    reports that he quit smoking about 51 years ago. He has never used smokeless tobacco. He reports that he drinks alcohol. He reports that he does not use illicit drugs. family history includes Cancer in his mother and other; Depression in his mother; Heart disease in his father; and Stroke in his father. No Known Allergies  Review of Systems Review of Systems  Constitutional: Negative for diaphoresis, activity change, appetite change and unexpected weight change.  HENT: Negative for hearing loss, ear pain, facial swelling, mouth sores and neck stiffness.   Eyes: Negative for pain, redness and visual disturbance.  Respiratory: Negative for shortness of breath and wheezing.   Cardiovascular: Negative for chest pain and palpitations.  Gastrointestinal: Negative for diarrhea, blood in stool, abdominal distention and rectal pain.  Genitourinary: Negative for hematuria, flank pain and decreased urine volume.  Musculoskeletal: Negative for myalgias and joint swelling.  Skin: Negative for color change and wound.  Neurological: Negative for syncope and numbness.  Hematological: Negative for adenopathy.  Psychiatric/Behavioral: Negative for hallucinations, self-injury, decreased concentration and agitation.      Objective:   Physical Exam BP 140/88  Pulse 64  Temp 97.6 F (36.4 C) (Oral)  Ht 5\' 8"  (1.727 m)  Wt 193 lb 4 oz (87.658 kg)  BMI 29.38 kg/m2  SpO2 95% Physical Exam  VS noted Constitutional: Pt is oriented to person, place, and time. Appears well-developed and well-nourished.  HENT:  Head: Normocephalic and atraumatic.  Right Ear: External ear normal.  Left Ear: External ear normal.  Nose: Nose normal.    Mouth/Throat: Oropharynx is clear and moist.  Eyes: Conjunctivae and EOM are normal. Pupils are equal, round, and reactive to light.  Neck: Normal range of motion. Neck supple. No JVD present. No tracheal deviation present.  Cardiovascular: Normal rate, regular rhythm, normal heart sounds and intact distal pulses.   Pulmonary/Chest: Effort normal and breath sounds normal.  Abdominal: Soft. Bowel sounds are normal. There is no tenderness.  Musculoskeletal: Normal range of motion. Exhibits no edema.  Lymphadenopathy:  Has no cervical adenopathy.  Neurological: Pt is alert and oriented to person, place, and time. Pt has normal reflexes. No cranial nerve deficit.  Skin: Skin is warm and dry. No rash noted.  Psychiatric:  Has  normal mood and affect. Behavior is normal. Except 1+ nerovus    Assessment & Plan:

## 2011-12-07 NOTE — Patient Instructions (Addendum)
Continue all other medications as before Please have the pharmacy call with any refills you may need. You should be due for the follow up carotid ultrasound testing in Jan 2014 You are otherwise up to date with prevention Please keep your appointments with your specialists as you have planned - Urology, and dermatology as you do Please return in 1 year for your yearly visit, or sooner if needed, with Lab testing done 3-5 days before

## 2012-04-10 ENCOUNTER — Other Ambulatory Visit: Payer: Self-pay | Admitting: Internal Medicine

## 2012-05-13 ENCOUNTER — Other Ambulatory Visit: Payer: Self-pay | Admitting: Internal Medicine

## 2012-06-04 ENCOUNTER — Telehealth: Payer: Self-pay | Admitting: Internal Medicine

## 2012-06-04 DIAGNOSIS — I6529 Occlusion and stenosis of unspecified carotid artery: Secondary | ICD-10-CM

## 2012-06-04 NOTE — Telephone Encounter (Signed)
Test is ordered.

## 2012-06-04 NOTE — Telephone Encounter (Signed)
Patient is calling because it is time for him to schedule his artery [place] and he would like to get that set up

## 2012-06-10 ENCOUNTER — Encounter (INDEPENDENT_AMBULATORY_CARE_PROVIDER_SITE_OTHER): Payer: Medicare Other

## 2012-06-10 DIAGNOSIS — I6529 Occlusion and stenosis of unspecified carotid artery: Secondary | ICD-10-CM

## 2012-06-12 ENCOUNTER — Encounter: Payer: Self-pay | Admitting: Internal Medicine

## 2012-08-29 ENCOUNTER — Other Ambulatory Visit: Payer: Self-pay | Admitting: Internal Medicine

## 2012-12-02 ENCOUNTER — Other Ambulatory Visit (INDEPENDENT_AMBULATORY_CARE_PROVIDER_SITE_OTHER): Payer: Medicare Other

## 2012-12-02 DIAGNOSIS — E785 Hyperlipidemia, unspecified: Secondary | ICD-10-CM

## 2012-12-02 DIAGNOSIS — Z0000 Encounter for general adult medical examination without abnormal findings: Secondary | ICD-10-CM

## 2012-12-02 DIAGNOSIS — I10 Essential (primary) hypertension: Secondary | ICD-10-CM

## 2012-12-02 LAB — CBC WITH DIFFERENTIAL/PLATELET
Basophils Absolute: 0 10*3/uL (ref 0.0–0.1)
Eosinophils Relative: 2.5 % (ref 0.0–5.0)
HCT: 45.5 % (ref 39.0–52.0)
Lymphocytes Relative: 24.6 % (ref 12.0–46.0)
Lymphs Abs: 1.6 10*3/uL (ref 0.7–4.0)
Monocytes Relative: 6.8 % (ref 3.0–12.0)
Platelets: 237 10*3/uL (ref 150.0–400.0)
RDW: 13.5 % (ref 11.5–14.6)
WBC: 6.5 10*3/uL (ref 4.5–10.5)

## 2012-12-02 LAB — TSH: TSH: 1.15 u[IU]/mL (ref 0.35–5.50)

## 2012-12-02 LAB — URINALYSIS, ROUTINE W REFLEX MICROSCOPIC
Bilirubin Urine: NEGATIVE
Leukocytes, UA: NEGATIVE
Nitrite: NEGATIVE
pH: 7 (ref 5.0–8.0)

## 2012-12-02 LAB — LIPID PANEL
HDL: 65.1 mg/dL (ref >39.00)
Total CHOL/HDL Ratio: 2

## 2012-12-02 LAB — BASIC METABOLIC PANEL
BUN: 20 mg/dL (ref 6–23)
CO2: 27 mEq/L (ref 19–32)
Calcium: 9.6 mg/dL (ref 8.4–10.5)
Creatinine, Ser: 1.1 mg/dL (ref 0.4–1.5)
GFR: 71.22 mL/min (ref >60.00)
Glucose, Bld: 79 mg/dL (ref 70–99)
Sodium: 140 mEq/L (ref 135–145)

## 2012-12-02 LAB — HEPATIC FUNCTION PANEL
ALT: 19 U/L (ref 0–53)
AST: 20 U/L (ref 0–37)
Alkaline Phosphatase: 51 U/L (ref 39–117)
Bilirubin, Direct: 0.1 mg/dL (ref 0.0–0.3)
Total Bilirubin: 0.6 mg/dL (ref 0.3–1.2)

## 2012-12-09 ENCOUNTER — Ambulatory Visit (INDEPENDENT_AMBULATORY_CARE_PROVIDER_SITE_OTHER): Payer: Medicare Other | Admitting: Internal Medicine

## 2012-12-09 ENCOUNTER — Encounter: Payer: Self-pay | Admitting: Internal Medicine

## 2012-12-09 DIAGNOSIS — G5603 Carpal tunnel syndrome, bilateral upper limbs: Secondary | ICD-10-CM | POA: Insufficient documentation

## 2012-12-09 DIAGNOSIS — G5602 Carpal tunnel syndrome, left upper limb: Secondary | ICD-10-CM

## 2012-12-09 DIAGNOSIS — Z136 Encounter for screening for cardiovascular disorders: Secondary | ICD-10-CM

## 2012-12-09 DIAGNOSIS — Z0000 Encounter for general adult medical examination without abnormal findings: Secondary | ICD-10-CM

## 2012-12-09 DIAGNOSIS — G5600 Carpal tunnel syndrome, unspecified upper limb: Secondary | ICD-10-CM

## 2012-12-09 DIAGNOSIS — M6530 Trigger finger, unspecified finger: Secondary | ICD-10-CM

## 2012-12-09 NOTE — Patient Instructions (Addendum)
Your EKG was OK today You are given the copy of your labs Please continue all other medications as before, and refills have been done if requested. Please have the pharmacy call with any other refills you may need. Please keep your appointments with your specialists as you have planned, including the follow up carotid doppler studies, and urology You will be contacted regarding the referral for: hand surgeon Please continue your efforts at being more active, low cholesterol diet, and weight control. .You are otherwise up to date with prevention measures today.  Please remember to sign up for My Chart if you have not done so, as this will be important to you in the future with finding out test results, communicating by private email, and scheduling acute appointments online when needed.  Please return in 1 year for your yearly visit, or sooner if needed, with Lab testing done 3-5 days before

## 2012-12-09 NOTE — Assessment & Plan Note (Signed)
Refer hand surgeon 

## 2012-12-09 NOTE — Assessment & Plan Note (Signed)

## 2012-12-09 NOTE — Progress Notes (Signed)
Subjective:    Patient ID: Brandon [name], male    DOB: Sep 03, 1940, 72 y.o.   MRN: [id number]  HPI  Here for wellness and f/u;  Overall doing ok;  Pt denies CP, worsening SOB, DOE, wheezing, orthopnea, PND, worsening LE edema, palpitations, dizziness or syncope.  Pt denies neurological change such as new headache, facial or extremity weakness.  Pt denies polydipsia, polyuria, or low sugar symptoms. Pt states overall good compliance with treatment and medications, good tolerability, and has been trying to follow lower cholesterol diet.  Pt denies worsening depressive symptoms, suicidal ideation or panic. No fever, night sweats, wt loss, loss of appetite, or other constitutional symptoms.  Pt states good ability with ADL's, has low fall risk, home safety reviewed and adequate, no other significant changes in hearing or vision, and only occasionally active with exercise.  Last urology with PSA approx 3 mo ago. Last carotid duplex RICA 60-79%.  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  Has been walking about 2 miles per day, lost 11 lbs since 2013, plans to lose 5 more Past Medical History  Diagnosis Date  . GERD 11/15/2006  . HYPERLIPIDEMIA 11/15/2006  . ERECTILE DYSFUNCTION 11/15/2006  . COLONIC POLYPS, HX OF 11/15/2006  . BENIGN PROSTATIC HYPERTROPHY 11/15/2006  . 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 PSA, INCREASED 11/14/2007  . LOW BACK PAIN 11/14/2007    recurrent  . PERIPHERAL VASCULAR DISEASE 11/25/2009    right carotid 60-79% July 2011  . 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 PARESTHESIA, HANDS 11/23/2008  . Lipoma 11/29/2010  . HYPERTENSION 11/15/2006    borderline  . 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 Inguinal hernia repair  2001  . Prostate biopsy  Fall 2009    S/P-neg-Dr. Annabell Howells  .  Colonoscopy    . Mass excision  04/06/2011    Procedure: EXCISION MASS;  Surgeon: Rulon Abide, DO;  Location: [facility];  Service: General;  Laterality: Right;  excision right  upper back mass    reports that he quit smoking about 52 years ago. He has never used smokeless tobacco. He reports that  drinks alcohol. He reports that he does not use illicit drugs. family history includes Cancer in his mother and other; Depression in his mother; Heart disease in his father; and Stroke in his father. No Known Allergies Current Outpatient Prescriptions on File Prior to Visit  Medication Sig Dispense Refill  . amLODipine (NORVASC) 5 MG tablet TAKE ONE TABLET BY MOUTH DAILY.  90 tablet  3  . aspirin 81 MG tablet Take 81 mg by mouth daily.        . Cinnamon 500 MG capsule Take 500 mg by mouth 2 (two) times daily.        . finasteride (PROSCAR) 5 MG tablet Take 5 mg by mouth daily.        . Garlic 1000 MG CAPS Take by mouth daily.        . Multiple Vitamin (MULTIVITAMIN) tablet Take 1 tablet by mouth daily.        . naproxen sodium (ANAPROX) 220 MG tablet Take 220 mg by mouth at bedtime.        Marland Kitchen omeprazole (PRILOSEC) 20 MG capsule TAKE ONE CAPSULE BY MOUTH EVERY DAY AS NEEDED  90 capsule  1  . pravastatin (PRAVACHOL) 40 MG tablet TAKE ONE TABLET BY MOUTH EVERY DAY  90 tablet  2   No current facility-administered medications on file prior to visit.   Review of Systems Constitutional: Negative for diaphoresis, activity change, appetite change or unexpected weight change.  HENT: Negative for hearing loss, ear pain, facial swelling, mouth sores and neck stiffness.   Eyes: Negative for pain, redness and visual disturbance.  Respiratory: Negative for shortness of breath and wheezing.   Cardiovascular: Negative for chest pain and palpitations.  Gastrointestinal: Negative for diarrhea, blood in stool, abdominal distention or other pain Genitourinary: Negative for hematuria, flank pain  or change in urine volume.  Musculoskeletal: Negative for myalgias and joint swelling.  Skin: Negative for color change and wound.  Neurological: Negative for syncope and numbness. other than noted Hematological: Negative for adenopathy.  Psychiatric/Behavioral: Negative for hallucinations, self-injury, decreased concentration and agitation.      Objective:   Physical Exam There were no vitals taken for this visit. VS noted,  Constitutional: Pt is oriented to person, place, and time. Appears well-developed and well-nourished.  Head: Normocephalic and atraumatic.  Right Ear: External ear normal.  Left Ear: External ear normal.  Nose: Nose normal.  Mouth/Throat: Oropharynx is clear and moist.  Eyes: Conjunctivae and EOM are normal. Pupils are equal, round, and reactive to light.  Neck: Normal range of motion. Neck supple. No JVD present. No tracheal deviation present.  Cardiovascular: Normal rate, regular rhythm, normal heart sounds and intact distal pulses.   Pulmonary/Chest: Effort normal and breath sounds normal.  Abdominal: Soft. Bowel sounds are normal. There is no tenderness. No HSM  Musculoskeletal: Normal range of motion. Exhibits no edema.  Lymphadenopathy:  Has no cervical adenopathy.  Neurological: Pt is alert and oriented to person, place, and time. Pt has normal reflexes. No cranial nerve deficit.  Skin: Skin is warm and dry. No rash noted.  Psychiatric:  Has  normal mood and affect. Behavior is normal.  Left hand neurovasc intact, but unable to fully extned 3rd and 4th fingers    Assessment & Plan:

## 2012-12-09 NOTE — Assessment & Plan Note (Signed)
Refer hand surgeon

## 2013-03-03 ENCOUNTER — Other Ambulatory Visit: Payer: Self-pay | Admitting: Internal Medicine

## 2013-04-30 ENCOUNTER — Other Ambulatory Visit: Payer: Self-pay | Admitting: Internal Medicine

## 2013-06-02 ENCOUNTER — Other Ambulatory Visit: Payer: Self-pay | Admitting: Internal Medicine

## 2013-07-01 ENCOUNTER — Ambulatory Visit (HOSPITAL_COMMUNITY): Payer: Medicare Other | Attending: Cardiology

## 2013-07-01 ENCOUNTER — Encounter: Payer: Self-pay | Admitting: Cardiology

## 2013-07-01 DIAGNOSIS — I6529 Occlusion and stenosis of unspecified carotid artery: Secondary | ICD-10-CM | POA: Insufficient documentation

## 2013-07-24 ENCOUNTER — Telehealth: Payer: Self-pay

## 2013-07-24 NOTE — Telephone Encounter (Signed)
Pt called [facility] requesting results from carotid study done on 07/02/13. Thanks

## 2013-07-24 NOTE — Telephone Encounter (Signed)
Right carotid with < 60% blockage, left carotid < 40%  No need for surgury at this time, to cont same tx, and was rec;d for f/u at 1 yr

## 2013-07-25 NOTE — Telephone Encounter (Signed)
Patient informed of results.  

## 2013-10-10 ENCOUNTER — Other Ambulatory Visit: Payer: Self-pay | Admitting: Internal Medicine

## 2013-12-12 ENCOUNTER — Other Ambulatory Visit (INDEPENDENT_AMBULATORY_CARE_PROVIDER_SITE_OTHER): Payer: Medicare Other

## 2013-12-12 DIAGNOSIS — E785 Hyperlipidemia, unspecified: Secondary | ICD-10-CM

## 2013-12-12 DIAGNOSIS — Z0000 Encounter for general adult medical examination without abnormal findings: Secondary | ICD-10-CM

## 2013-12-12 LAB — URINALYSIS, ROUTINE W REFLEX MICROSCOPIC
Bilirubin Urine: NEGATIVE
HGB URINE DIPSTICK: NEGATIVE
Ketones, ur: NEGATIVE
Leukocytes, UA: NEGATIVE
NITRITE: NEGATIVE
PH: 7 (ref 5.0–8.0)
RBC / HPF: NONE SEEN (ref 0–?)
SPECIFIC GRAVITY, URINE: 1.01 (ref 1.000–1.030)
TOTAL PROTEIN, URINE-UPE24: NEGATIVE
URINE GLUCOSE: NEGATIVE
Urobilinogen, UA: 0.2 (ref 0.0–1.0)

## 2013-12-12 LAB — CBC WITH DIFFERENTIAL/PLATELET
BASOS ABS: 0 10*3/uL (ref 0.0–0.1)
Basophils Relative: 0.6 % (ref 0.0–3.0)
Eosinophils Absolute: 0.2 10*3/uL (ref 0.0–0.7)
Eosinophils Relative: 2.7 % (ref 0.0–5.0)
HEMATOCRIT: 45.7 % (ref 39.0–52.0)
Hemoglobin: 15.3 g/dL (ref 13.0–17.0)
LYMPHS ABS: 1.5 10*3/uL (ref 0.7–4.0)
Lymphocytes Relative: 23.4 % (ref 12.0–46.0)
MCHC: 33.5 g/dL (ref 30.0–36.0)
MCV: 91 fl (ref 78.0–100.0)
MONOS PCT: 7.1 % (ref 3.0–12.0)
Monocytes Absolute: 0.4 10*3/uL (ref 0.1–1.0)
NEUTROS PCT: 66.2 % (ref 43.0–77.0)
Neutro Abs: 4.2 10*3/uL (ref 1.4–7.7)
PLATELETS: 236 10*3/uL (ref 150.0–400.0)
RBC: 5.03 Mil/uL (ref 4.22–5.81)
RDW: 14.1 % (ref 11.5–15.5)
WBC: 6.3 10*3/uL (ref 4.0–10.5)

## 2013-12-12 LAB — LIPID PANEL
CHOLESTEROL: 173 mg/dL (ref 0–200)
HDL: 72 mg/dL (ref >39.00)
LDL Cholesterol: 92 mg/dL (ref 0–99)
NONHDL: 101
Total CHOL/HDL Ratio: 2
Triglycerides: 44 mg/dL (ref 0.0–149.0)
VLDL: 8.8 mg/dL (ref 0.0–40.0)

## 2013-12-12 LAB — BASIC METABOLIC PANEL
BUN: 18 mg/dL (ref 6–23)
CALCIUM: 10.2 mg/dL (ref 8.4–10.5)
CO2: 27 mEq/L (ref 19–32)
CREATININE: 1.2 mg/dL (ref 0.4–1.5)
Chloride: 106 mEq/L (ref 96–112)
GFR: 62.29 mL/min (ref >60.00)
GLUCOSE: 90 mg/dL (ref 70–99)
POTASSIUM: 4.2 meq/L (ref 3.5–5.1)
Sodium: 137 mEq/L (ref 135–145)

## 2013-12-12 LAB — HEPATIC FUNCTION PANEL
ALT: 19 U/L (ref 0–53)
AST: 18 U/L (ref 0–37)
Albumin: 4.1 g/dL (ref 3.5–5.2)
Alkaline Phosphatase: 46 U/L (ref 39–117)
BILIRUBIN DIRECT: 0.2 mg/dL (ref 0.0–0.3)
Total Bilirubin: 1 mg/dL (ref 0.2–1.2)
Total Protein: 7 g/dL (ref 6.0–8.3)

## 2013-12-12 LAB — TSH: TSH: 0.92 u[IU]/mL (ref 0.35–4.50)

## 2013-12-16 ENCOUNTER — Ambulatory Visit (INDEPENDENT_AMBULATORY_CARE_PROVIDER_SITE_OTHER): Payer: Medicare Other | Admitting: Internal Medicine

## 2013-12-16 ENCOUNTER — Encounter: Payer: Self-pay | Admitting: Internal Medicine

## 2013-12-16 VITALS — BP 140/86 | HR 64 | Temp 97.9°F | Wt 189.5 lb

## 2013-12-16 DIAGNOSIS — Z23 Encounter for immunization: Secondary | ICD-10-CM

## 2013-12-16 DIAGNOSIS — M6530 Trigger finger, unspecified finger: Secondary | ICD-10-CM

## 2013-12-16 DIAGNOSIS — G5602 Carpal tunnel syndrome, left upper limb: Secondary | ICD-10-CM

## 2013-12-16 DIAGNOSIS — Z0000 Encounter for general adult medical examination without abnormal findings: Secondary | ICD-10-CM

## 2013-12-16 DIAGNOSIS — E785 Hyperlipidemia, unspecified: Secondary | ICD-10-CM

## 2013-12-16 DIAGNOSIS — G5600 Carpal tunnel syndrome, unspecified upper limb: Secondary | ICD-10-CM

## 2013-12-16 NOTE — Assessment & Plan Note (Signed)
For surgury later this yr likely

## 2013-12-16 NOTE — Patient Instructions (Addendum)
You had the new Prevnar pneumonia shot today  Please continue all other medications as before, and refills have been done if requested.  Please have the pharmacy call with any other refills you may need.  Please continue your efforts at being more active, low cholesterol diet, and weight control.  You are otherwise up to date with prevention measures today.  Please keep your appointments with your specialists as you may have planned  Please return in 1 year for your yearly visit, or sooner if needed, with Lab testing done 3-5 days before

## 2013-12-16 NOTE — Progress Notes (Signed)
Pre visit review using our clinic review tool, if applicable. No additional management support is needed unless otherwise documented below in the visit note. 

## 2013-12-16 NOTE — Progress Notes (Signed)
Subjective:    Patient ID: Brandon [name], male    DOB: 1940/10/20, 73 y.o.   MRN: [id number]  HPI Here for wellness and f/u;  Overall doing ok;  Pt denies CP, worsening SOB, DOE, wheezing, orthopnea, PND, worsening LE edema, palpitations, dizziness or syncope.  Pt denies neurological change such as new headache, facial or extremity weakness.  Pt denies polydipsia, polyuria, or low sugar symptoms. Pt states overall good compliance with treatment and medications, good tolerability, and has been trying to follow lower cholesterol diet.  Pt denies worsening depressive symptoms, suicidal ideation or panic. No fever, night sweats, wt loss, loss of appetite, or other constitutional symptoms.  Pt states good ability with ADL's, has low fall risk, home safety reviewed and adequate, no other significant changes in hearing or vision, and only occasionally active with exercise.  Stopped his fish oil.  Only taking the statin qod as he had some muscle soreness recently with working in the yard.  Taking Mg and otc MVI. 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  To see urology in sept with psa q 6 mo Past Medical History  Diagnosis Date  . GERD 11/15/2006  . HYPERLIPIDEMIA 11/15/2006  . ERECTILE DYSFUNCTION 11/15/2006  . COLONIC POLYPS, HX OF 11/15/2006  . BENIGN PROSTATIC HYPERTROPHY 11/15/2006  . 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 PSA, INCREASED 11/14/2007  . LOW BACK PAIN 11/14/2007    recurrent  . PERIPHERAL VASCULAR DISEASE 11/25/2009    right carotid 60-79% July 2011  . 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 PARESTHESIA, HANDS 11/23/2008  . Lipoma 11/29/2010  . HYPERTENSION 11/15/2006    borderline  . 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 Inguinal hernia repair  2001  . Prostate biopsy  Fall 2009    S/P-neg-Dr. Jeffie Pollock  . Colonoscopy     . Mass excision  04/06/2011    Procedure: EXCISION MASS;  Surgeon: Judieth Keens, DO;  Location: [facility];  Service: General;  Laterality: Right;  excision right  upper back mass    reports that he quit smoking about 53 years ago. He has never used smokeless tobacco. He reports that he drinks alcohol. He reports that he does not use illicit drugs. family history includes Cancer in his mother and other; Depression in his mother; Heart disease in his father; Stroke in his father. No Known Allergies Current Outpatient Prescriptions on File Prior to Visit  Medication Sig Dispense Refill  . amLODipine (NORVASC) 5 MG tablet TAKE ONE TABLET BY MOUTH DAILY.  90 tablet  3  . amLODipine (NORVASC) 5 MG tablet TAKE ONE TABLET BY MOUTH DAILY.  90 tablet  3  . aspirin 81 MG tablet Take 81 mg by mouth daily.        . Cinnamon 500 MG capsule Take 500 mg by mouth 2 (two) times daily.        . finasteride (PROSCAR) 5 MG tablet Take 5 mg by mouth daily.        . Multiple Vitamin (MULTIVITAMIN) tablet Take 1 tablet by mouth daily.        Marland Kitchen omeprazole (PRILOSEC) 20 MG capsule TAKE ONE CAPSULE BY MOUTH ONCE DAILY AS NEEDED  90 capsule  0  . pravastatin (PRAVACHOL) 40 MG tablet TAKE ONE TABLET BY MOUTH EVERY DAY  90 tablet  3   No current facility-administered medications on file prior to visit.   Review of Systems Constitutional: Negative for increased diaphoresis, other activity, appetite or other siginficant weight change  HENT: Negative for worsening hearing loss, ear pain, facial swelling, mouth sores and neck stiffness.   Eyes: Negative for other worsening pain, redness or visual disturbance.  Respiratory: Negative for shortness of breath and wheezing.   Cardiovascular: Negative for chest pain and palpitations.  Gastrointestinal: Negative for diarrhea, blood in stool, abdominal distention or other pain Genitourinary: Negative for hematuria, flank pain or change in urine volume.    Musculoskeletal: Negative for myalgias or other joint complaints.  Skin: Negative for color change and wound.  Neurological: Negative for syncope and numbness. other than noted Hematological: Negative for adenopathy. or other swelling Psychiatric/Behavioral: Negative for hallucinations, self-injury, decreased concentration or other worsening agitation.      Objective:   Physical Exam BP 140/86  Pulse 64  Temp(Src) 97.9 F (36.6 C) (Oral)  Wt 189 lb 8 oz (85.957 kg)  SpO2 97% VS noted,  Constitutional: Pt is oriented to person, place, and time. Appears well-developed and well-nourished.  Head: Normocephalic and atraumatic.  Right Ear: External ear normal.  Left Ear: External ear normal.  Nose: Nose normal.  Mouth/Throat: Oropharynx is clear and moist.  Eyes: Conjunctivae and EOM are normal. Pupils are equal, round, and reactive to light.  Neck: Normal range of motion. Neck supple. No JVD present. No tracheal deviation present.  Cardiovascular: Normal rate, regular rhythm, normal heart sounds and intact distal pulses.   Pulmonary/Chest: Effort normal and breath sounds without rales or wheezing  Abdominal: Soft. Bowel sounds are normal. NT. No HSM  Musculoskeletal: Normal range of motion. Exhibits no edema.  Lymphadenopathy:  Has no cervical adenopathy.  Neurological: Pt is alert and oriented to person, place, and time. Pt has normal reflexes. No cranial nerve deficit. Motor grossly intact Skin: Skin is warm and dry. No rash noted.  Psychiatric:  Has normal mood and affect. Behavior is normal.     Assessment & Plan:

## 2013-12-16 NOTE — Assessment & Plan Note (Signed)
For surg later this yr liekly

## 2013-12-16 NOTE — Assessment & Plan Note (Signed)

## 2013-12-16 NOTE — Addendum Note (Signed)
Addended by: Sharon Seller B on: 12/16/2013 09:03 AM   Modules accepted: Orders

## 2013-12-16 NOTE — Assessment & Plan Note (Signed)
Encourage to take statin daily,  to f/u any worsening symptoms or concerns Lab Results  Component Value Date   LDLCALC 92 12/12/2013

## 2014-01-19 ENCOUNTER — Other Ambulatory Visit: Payer: Self-pay | Admitting: Internal Medicine

## 2014-04-10 ENCOUNTER — Other Ambulatory Visit: Payer: Self-pay | Admitting: Internal Medicine

## 2014-06-25 ENCOUNTER — Encounter (HOSPITAL_COMMUNITY): Payer: Medicare Other

## 2014-07-06 ENCOUNTER — Other Ambulatory Visit (HOSPITAL_COMMUNITY): Payer: Self-pay | Admitting: Cardiology

## 2014-07-06 DIAGNOSIS — I6523 Occlusion and stenosis of bilateral carotid arteries: Secondary | ICD-10-CM

## 2014-07-13 ENCOUNTER — Ambulatory Visit (HOSPITAL_COMMUNITY): Payer: Medicare Other | Attending: Cardiology | Admitting: Cardiology

## 2014-07-13 DIAGNOSIS — I6523 Occlusion and stenosis of bilateral carotid arteries: Secondary | ICD-10-CM | POA: Diagnosis not present

## 2014-07-13 NOTE — Progress Notes (Signed)
Carotid duplex

## 2014-07-14 ENCOUNTER — Encounter: Payer: Self-pay | Admitting: Internal Medicine

## 2014-10-11 ENCOUNTER — Other Ambulatory Visit: Payer: Self-pay | Admitting: Internal Medicine

## 2014-10-12 NOTE — Telephone Encounter (Signed)
Has not been seen since 12/16/13. Please advise on refill

## 2014-10-26 ENCOUNTER — Other Ambulatory Visit: Payer: Self-pay | Admitting: Internal Medicine

## 2014-10-30 DIAGNOSIS — N401 Enlarged prostate with lower urinary tract symptoms: Secondary | ICD-10-CM | POA: Diagnosis not present

## 2014-10-30 DIAGNOSIS — N528 Other male erectile dysfunction: Secondary | ICD-10-CM | POA: Diagnosis not present

## 2014-10-30 DIAGNOSIS — R972 Elevated prostate specific antigen [PSA]: Secondary | ICD-10-CM | POA: Diagnosis not present

## 2014-11-19 ENCOUNTER — Encounter: Payer: Self-pay | Admitting: Internal Medicine

## 2014-12-18 ENCOUNTER — Other Ambulatory Visit (INDEPENDENT_AMBULATORY_CARE_PROVIDER_SITE_OTHER): Payer: Medicare Other

## 2014-12-18 DIAGNOSIS — Z0000 Encounter for general adult medical examination without abnormal findings: Secondary | ICD-10-CM

## 2014-12-18 DIAGNOSIS — E785 Hyperlipidemia, unspecified: Secondary | ICD-10-CM | POA: Diagnosis not present

## 2014-12-18 DIAGNOSIS — I10 Essential (primary) hypertension: Secondary | ICD-10-CM | POA: Diagnosis not present

## 2014-12-18 LAB — BASIC METABOLIC PANEL
BUN: 20 mg/dL (ref 6–23)
CHLORIDE: 105 meq/L (ref 96–112)
CO2: 26 meq/L (ref 19–32)
CREATININE: 1.11 mg/dL (ref 0.40–1.50)
Calcium: 9.5 mg/dL (ref 8.4–10.5)
GFR: 68.62 mL/min (ref >60.00)
GLUCOSE: 94 mg/dL (ref 70–99)
Potassium: 4.2 mEq/L (ref 3.5–5.1)
Sodium: 138 mEq/L (ref 135–145)

## 2014-12-18 LAB — URINALYSIS, ROUTINE W REFLEX MICROSCOPIC
BILIRUBIN URINE: NEGATIVE
Hgb urine dipstick: NEGATIVE
KETONES UR: NEGATIVE
Leukocytes, UA: NEGATIVE
NITRITE: NEGATIVE
PH: 7 (ref 5.0–8.0)
SPECIFIC GRAVITY, URINE: 1.01 (ref 1.000–1.030)
Total Protein, Urine: NEGATIVE
URINE GLUCOSE: NEGATIVE
UROBILINOGEN UA: 0.2 (ref 0.0–1.0)
WBC UA: NONE SEEN (ref 0–?)

## 2014-12-18 LAB — LIPID PANEL
Cholesterol: 153 mg/dL (ref 0–200)
HDL: 63.2 mg/dL (ref >39.00)
LDL Cholesterol: 78 mg/dL (ref 0–99)
NONHDL: 89.81
TRIGLYCERIDES: 59 mg/dL (ref 0.0–149.0)
Total CHOL/HDL Ratio: 2
VLDL: 11.8 mg/dL (ref 0.0–40.0)

## 2014-12-18 LAB — CBC WITH DIFFERENTIAL/PLATELET
BASOS ABS: 0.1 10*3/uL (ref 0.0–0.1)
Basophils Relative: 0.7 % (ref 0.0–3.0)
EOS ABS: 0.2 10*3/uL (ref 0.0–0.7)
Eosinophils Relative: 2.6 % (ref 0.0–5.0)
HEMATOCRIT: 46.2 % (ref 39.0–52.0)
HEMOGLOBIN: 15.8 g/dL (ref 13.0–17.0)
LYMPHS PCT: 24.6 % (ref 12.0–46.0)
Lymphs Abs: 1.8 10*3/uL (ref 0.7–4.0)
MCHC: 34.1 g/dL (ref 30.0–36.0)
MCV: 89.4 fl (ref 78.0–100.0)
MONO ABS: 0.5 10*3/uL (ref 0.1–1.0)
Monocytes Relative: 7.4 % (ref 3.0–12.0)
Neutro Abs: 4.7 10*3/uL (ref 1.4–7.7)
Neutrophils Relative %: 64.7 % (ref 43.0–77.0)
PLATELETS: 258 10*3/uL (ref 150.0–400.0)
RBC: 5.17 Mil/uL (ref 4.22–5.81)
RDW: 14.2 % (ref 11.5–15.5)
WBC: 7.2 10*3/uL (ref 4.0–10.5)

## 2014-12-18 LAB — HEPATIC FUNCTION PANEL
ALT: 14 U/L (ref 0–53)
AST: 17 U/L (ref 0–37)
Albumin: 4.2 g/dL (ref 3.5–5.2)
Alkaline Phosphatase: 49 U/L (ref 39–117)
Bilirubin, Direct: 0.2 mg/dL (ref 0.0–0.3)
TOTAL PROTEIN: 6.9 g/dL (ref 6.0–8.3)
Total Bilirubin: 0.7 mg/dL (ref 0.2–1.2)

## 2014-12-18 LAB — TSH: TSH: 0.96 u[IU]/mL (ref 0.35–4.50)

## 2014-12-22 ENCOUNTER — Ambulatory Visit (INDEPENDENT_AMBULATORY_CARE_PROVIDER_SITE_OTHER): Payer: Medicare Other | Admitting: Internal Medicine

## 2014-12-22 ENCOUNTER — Encounter: Payer: Self-pay | Admitting: Internal Medicine

## 2014-12-22 VITALS — BP 126/80 | HR 61 | Temp 97.7°F | Ht 68.0 in | Wt 187.0 lb

## 2014-12-22 DIAGNOSIS — Z0000 Encounter for general adult medical examination without abnormal findings: Secondary | ICD-10-CM | POA: Diagnosis not present

## 2014-12-22 DIAGNOSIS — I10 Essential (primary) hypertension: Secondary | ICD-10-CM

## 2014-12-22 DIAGNOSIS — K219 Gastro-esophageal reflux disease without esophagitis: Secondary | ICD-10-CM

## 2014-12-22 DIAGNOSIS — E785 Hyperlipidemia, unspecified: Secondary | ICD-10-CM

## 2014-12-22 NOTE — Assessment & Plan Note (Signed)
stable overall by history and exam, recent data reviewed with pt, and pt to continue medical treatment as before,  to f/u any worsening symptoms or concerns Lab Results  Component Value Date   LDLCALC 78 12/18/2014

## 2014-12-22 NOTE — Assessment & Plan Note (Signed)
Ok for omeprazole prn

## 2014-12-22 NOTE — Patient Instructions (Addendum)
Please continue all other medications as before, and refills have been done if requested.  Please have the pharmacy call with any other refills you may need.  Please continue your efforts at being more active, low cholesterol diet, and weight control.  You are otherwise up to date with prevention measures today.  Please keep your appointments with your specialists as you may have planned  Please return in 6 months, or sooner if needed, with Lab testing done 3-5 days before  

## 2014-12-22 NOTE — Assessment & Plan Note (Addendum)

## 2014-12-22 NOTE — Progress Notes (Signed)
Pre visit review using our clinic review tool, if applicable. No additional management support is needed unless otherwise documented below in the visit note. 

## 2014-12-22 NOTE — Assessment & Plan Note (Signed)
stable overall by history and exam, recent data reviewed with pt, and pt to continue medical treatment as before,  to f/u any worsening symptoms or concerns BP Readings from Last 3 Encounters:  12/22/14 126/80  12/16/13 140/86  12/07/11 140/88

## 2014-12-22 NOTE — Progress Notes (Signed)
Subjective:    Patient ID: Brandon [name], male    DOB: 07/31/1940, 74 y.o.   MRN: [id number]  HPI  Here for wellness and f/u;  Overall doing ok;  Pt denies Chest pain, worsening SOB, DOE, wheezing, orthopnea, PND, worsening LE edema, palpitations, dizziness or syncope.  Pt denies neurological change such as new headache, facial or extremity weakness.  Pt denies polydipsia, polyuria, or low sugar symptoms. Pt states overall good compliance with treatment and medications, good tolerability, and has been trying to follow appropriate diet.  Pt denies worsening depressive symptoms, suicidal ideation or panic. No fever, night sweats, wt loss, loss of appetite, or other constitutional symptoms.  Pt states good ability with ADL's, has low fall risk, home safety reviewed and adequate, no other significant changes in hearing or vision, and only occasionally active with exercise. Trying to work on reflux wiuth mylanta and omperazole prn. Has some occas left bicep tendonitis, still plays golf several times per wk, has some arthritic pains of the neck, shoulders, but no lower back or knee pain.  Sees urology with at least yearly psa, stable per pt Past Medical History  Diagnosis Date  . GERD 11/15/2006  . HYPERLIPIDEMIA 11/15/2006  . ERECTILE DYSFUNCTION 11/15/2006  . COLONIC POLYPS, HX OF 11/15/2006  . BENIGN PROSTATIC HYPERTROPHY 11/15/2006  . 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 PSA, INCREASED 11/14/2007  . LOW BACK PAIN 11/14/2007    recurrent  . PERIPHERAL VASCULAR DISEASE 11/25/2009    right carotid 60-79% July 2011  . 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 PARESTHESIA, HANDS 11/23/2008  . Lipoma 11/29/2010  . HYPERTENSION 11/15/2006    borderline  . 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 Inguinal hernia repair  2001  . Prostate biopsy  Fall 2009    S/P-neg-Dr. Jeffie Pollock  .  Colonoscopy    . Mass excision  04/06/2011    Procedure: EXCISION MASS;  Surgeon: Judieth Keens, DO;  Location: [facility];  Service: General;  Laterality: Right;  excision right  upper back mass    reports that he quit smoking about 54 years ago. He has never used smokeless tobacco. He reports that he drinks alcohol. He reports that he does not use illicit drugs. family history includes Cancer in his mother and other; Depression in his mother; Heart disease in his father; Stroke in his father. No Known Allergies Current Outpatient Prescriptions on File Prior to Visit  Medication Sig Dispense Refill  . Acetaminophen (TYLENOL ARTHRITIS PAIN PO) Take by mouth daily.    Marland Kitchen amLODipine (NORVASC) 5 MG tablet TAKE ONE TABLET BY MOUTH DAILY. 90 tablet 3  . aspirin 81 MG tablet Take 81 mg by mouth daily.      . Cinnamon 500 MG capsule Take 500 mg by mouth 2 (two) times daily.      . finasteride (PROSCAR) 5 MG tablet Take 5 mg by mouth daily.      . Multiple Vitamin (MULTIVITAMIN) tablet Take 1 tablet by mouth daily.      . pravastatin (PRAVACHOL) 40 MG tablet TAKE 1 TABLET BY MOUTH EVERY DAY 90 tablet 1  . vitamin C (ASCORBIC ACID) 500 MG tablet Take 500 mg by mouth daily.    . vitamin E 400 UNIT capsule Take 400 Units by mouth daily.    Marland Kitchen omeprazole (PRILOSEC) 20 MG capsule TAKE ONE CAPSULE  BY MOUTH ONCE DAILY AS NEEDED. (Patient not taking: Reported on 12/22/2014) 90 capsule 1   No current facility-administered medications on file prior to visit.    Review of Systems Constitutional: Negative for increased diaphoresis, other activity, appetite or siginficant weight change other than noted HENT: Negative for worsening hearing loss, ear pain, facial swelling, mouth sores and neck stiffness.   Eyes: Negative for other worsening pain, redness or visual disturbance.  Respiratory: Negative for shortness of breath and wheezing  Cardiovascular: Negative for chest pain and palpitations.    Gastrointestinal: Negative for diarrhea, blood in stool, abdominal distention or other pain Genitourinary: Negative for hematuria, flank pain or change in urine volume.  Musculoskeletal: Negative for myalgias or other joint complaints.  Skin: Negative for color change and wound or drainage.  Neurological: Negative for syncope and numbness. other than noted Hematological: Negative for adenopathy. or other swelling Psychiatric/Behavioral: Negative for hallucinations, SI, self-injury, decreased concentration or other worsening agitation.      Objective:   Physical Exam BP 126/80 mmHg  Pulse 61  Temp(Src) 97.7 F (36.5 C) (Oral)  Ht 5\' 8"  (1.727 m)  Wt 187 lb (84.823 kg)  BMI 28.44 kg/m2  SpO2 97% VS noted,  Constitutional: Pt is oriented to person, place, and time. Appears well-developed and well-nourished, in no significant distress Head: Normocephalic and atraumatic.  Right Ear: External ear normal.  Left Ear: External ear normal.  Nose: Nose normal.  Mouth/Throat: Oropharynx is clear and moist.  Eyes: Conjunctivae and EOM are normal. Pupils are equal, round, and reactive to light.  Neck: Normal range of motion. Neck supple. No JVD present. No tracheal deviation present or significant neck LA or mass Cardiovascular: Normal rate, regular rhythm, normal heart sounds and intact distal pulses.   Pulmonary/Chest: Effort normal and breath sounds without rales or wheezing  Abdominal: Soft. Bowel sounds are normal. NT. No HSM  Musculoskeletal: Normal range of motion. Exhibits no edema.  Lymphadenopathy:  Has no cervical adenopathy.  Neurological: Pt is alert and oriented to person, place, and time. Pt has normal reflexes. No cranial nerve deficit. Motor grossly intact Skin: Skin is warm and dry. No rash noted.  Psychiatric:  Has normal mood and affect. Behavior is normal.      Assessment & Plan:

## 2014-12-23 NOTE — Addendum Note (Signed)
Addended by: [name] on: 12/23/2014 12:47 PM   Modules accepted: Miquel Dunn

## 2015-03-11 DIAGNOSIS — X32XXXD Exposure to sunlight, subsequent encounter: Secondary | ICD-10-CM | POA: Diagnosis not present

## 2015-03-11 DIAGNOSIS — C44619 Basal cell carcinoma of skin of left upper limb, including shoulder: Secondary | ICD-10-CM | POA: Diagnosis not present

## 2015-03-11 DIAGNOSIS — L821 Other seborrheic keratosis: Secondary | ICD-10-CM | POA: Diagnosis not present

## 2015-03-11 DIAGNOSIS — D225 Melanocytic nevi of trunk: Secondary | ICD-10-CM | POA: Diagnosis not present

## 2015-03-11 DIAGNOSIS — L570 Actinic keratosis: Secondary | ICD-10-CM | POA: Diagnosis not present

## 2015-03-11 DIAGNOSIS — D1801 Hemangioma of skin and subcutaneous tissue: Secondary | ICD-10-CM | POA: Diagnosis not present

## 2015-03-24 ENCOUNTER — Encounter: Payer: Self-pay | Admitting: Internal Medicine

## 2015-03-24 ENCOUNTER — Ambulatory Visit (INDEPENDENT_AMBULATORY_CARE_PROVIDER_SITE_OTHER): Payer: Medicare Other | Admitting: Internal Medicine

## 2015-03-24 ENCOUNTER — Other Ambulatory Visit (INDEPENDENT_AMBULATORY_CARE_PROVIDER_SITE_OTHER): Payer: Medicare Other

## 2015-03-24 VITALS — BP 132/82 | HR 64 | Temp 97.9°F | Ht 68.0 in | Wt 190.0 lb

## 2015-03-24 DIAGNOSIS — M25511 Pain in right shoulder: Secondary | ICD-10-CM | POA: Diagnosis not present

## 2015-03-24 DIAGNOSIS — M25512 Pain in left shoulder: Secondary | ICD-10-CM | POA: Diagnosis not present

## 2015-03-24 DIAGNOSIS — M791 Myalgia: Secondary | ICD-10-CM | POA: Diagnosis not present

## 2015-03-24 DIAGNOSIS — E785 Hyperlipidemia, unspecified: Secondary | ICD-10-CM

## 2015-03-24 DIAGNOSIS — I10 Essential (primary) hypertension: Secondary | ICD-10-CM

## 2015-03-24 DIAGNOSIS — M7918 Myalgia, other site: Secondary | ICD-10-CM | POA: Insufficient documentation

## 2015-03-24 LAB — C-REACTIVE PROTEIN: CRP: 0.2 mg/dL — ABNORMAL LOW (ref 0.5–20.0)

## 2015-03-24 LAB — SEDIMENTATION RATE: Sed Rate: 7 mm/hr (ref 0–22)

## 2015-03-24 NOTE — Progress Notes (Signed)
Subjective:    Patient ID: Brandon [name], male    DOB: 1941/03/26, 74 y.o.   MRN: [id number]  HPI  Here with bilat arm pain for one month, seemed to start with LUE pain after playing golf, then a few days later was washing the car with the RUE and felt some possible injury to elbow and shoulder.  Has some other muscle joints soreness, seems to take longer to recoup these days after some exercise.  Pain is constant, wax and wane, mild to mod, seemed a bit better last night, almost did not come for appt, but also woke up twice last night with pain.  Pain worse on the right currenlty.  Has some upper back tightness kind of discomfort, no specific neck pain. No joint swelling.  No fever, has not felt wam, no hx of PMR.   Past Medical History  Diagnosis Date  . GERD 11/15/2006  . HYPERLIPIDEMIA 11/15/2006  . ERECTILE DYSFUNCTION 11/15/2006  . COLONIC POLYPS, HX OF 11/15/2006  . BENIGN PROSTATIC HYPERTROPHY 11/15/2006  . 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 PSA, INCREASED 11/14/2007  . LOW BACK PAIN 11/14/2007    recurrent  . PERIPHERAL VASCULAR DISEASE 11/25/2009    right carotid 60-79% July 2011  . 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 PARESTHESIA, HANDS 11/23/2008  . Lipoma 11/29/2010  . HYPERTENSION 11/15/2006    borderline  . 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 Inguinal hernia repair  2001  . Prostate biopsy  Fall 2009    S/P-neg-Dr. Jeffie Pollock  . Colonoscopy    . Mass excision  04/06/2011    Procedure: EXCISION MASS;  Surgeon: Judieth Keens, DO;  Location: [facility];  Service: General;  Laterality: Right;  excision right  upper back mass    reports that he quit smoking about 55 years ago. He has never used smokeless tobacco. He reports that he drinks alcohol. He reports that he does not use illicit drugs. family history includes Cancer in his  mother and other; Depression in his mother; Heart disease in his father; Stroke in his father. No Known Allergies Current Outpatient Prescriptions on File Prior to Visit  Medication Sig Dispense Refill  . Acetaminophen (TYLENOL ARTHRITIS PAIN PO) Take by mouth daily.    Marland Kitchen amLODipine (NORVASC) 5 MG tablet TAKE ONE TABLET BY MOUTH DAILY. 90 tablet 3  . aspirin 81 MG tablet Take 81 mg by mouth daily.      . calcium & magnesium carbonates (MYLANTA) 311-232 MG per tablet Take 1 tablet by mouth daily.    . Cinnamon 500 MG capsule Take 500 mg by mouth 2 (two) times daily.      . finasteride (PROSCAR) 5 MG tablet Take 5 mg by mouth daily.      . Multiple Vitamin (MULTIVITAMIN) tablet Take 1 tablet by mouth daily.      Marland Kitchen omeprazole (PRILOSEC) 20 MG capsule TAKE ONE CAPSULE BY MOUTH ONCE DAILY AS NEEDED. 90 capsule 1  . pravastatin (PRAVACHOL) 40 MG tablet TAKE 1 TABLET BY MOUTH EVERY DAY 90 tablet 1  . vitamin C (ASCORBIC ACID) 500 MG tablet Take 500 mg by mouth daily.    . vitamin E 400 UNIT capsule Take 400 Units by mouth daily.     No current facility-administered medications on file prior to visit.   Review of Systems  Constitutional: Negative  for unusual diaphoresis or night sweats HENT: Negative for ringing in ear or discharge Eyes: Negative for double vision or worsening visual disturbance.  Respiratory: Negative for choking and stridor.   Gastrointestinal: Negative for vomiting or other signifcant bowel change Genitourinary: Negative for hematuria or change in urine volume.  Musculoskeletal: Negative for other MSK pain or swelling Skin: Negative for color change and worsening wound.  Neurological: Negative for tremors and numbness other than noted  Psychiatric/Behavioral: Negative for decreased concentration or agitation other than above       Objective:   Physical Exam BP 132/82 mmHg  Pulse 64  Temp(Src) 97.9 F (36.6 C) (Oral)  Ht 5\' 8"  (1.727 m)  Wt 190 lb (86.183 kg)  BMI  28.90 kg/m2  SpO2 96% VS noted,  Constitutional: Pt appears in no significant distress HENT: Head: NCAT.  Right Ear: External ear normal.  Left Ear: External ear normal.  Eyes: . Pupils are equal, round, and reactive to light. Conjunctivae and EOM are normal Neck: Normal range of motion. Neck supple.  Cardiovascular: Normal rate and regular rhythm.   Pulmonary/Chest: Effort normal and breath sounds without rales or wheezing.  Abd:  Soft, NT, ND, + BS Neurological: Pt is alert. Not confused , motor grossly intact Skin: Skin is warm. No rash, no LE edema Psychiatric: Pt behavior is normal. No agitation.  Right shoulder with near FROM, NT, no swelling; points to bicep and bicipital tendon insertion site area as primary site of pain Left shoulder with non tender, FROM, no swelling    Assessment & Plan:

## 2015-03-24 NOTE — Assessment & Plan Note (Signed)
Overall mild it seems but persistent, doubt specifically releated to statin, cant r/o PMR - for esr, and crp today, to cont tylenol/alleve prn, pt requests referral sports medicine as well

## 2015-03-24 NOTE — Assessment & Plan Note (Signed)
Ok for change to crestor if msk pains resolve with stopping the pravastatin

## 2015-03-24 NOTE — Assessment & Plan Note (Signed)
stable overall by history and exam, recent data reviewed with pt, and pt to continue medical treatment as before,  to f/u any worsening symptoms or concerns BP Readings from Last 3 Encounters:  03/24/15 132/82  12/22/14 126/80  12/16/13 140/86

## 2015-03-24 NOTE — Progress Notes (Signed)
Pre visit review using our clinic review tool, if applicable. No additional management support is needed unless otherwise documented below in the visit note. 

## 2015-03-24 NOTE — Assessment & Plan Note (Signed)
?   Statin related, ok to hold on pravastatin for 3 wks, but if not improved, ok to restart

## 2015-03-24 NOTE — Patient Instructions (Signed)
OK to hold on taking the Pravastatin for 3 weeks; if the pain and aches go away, please call for a change of the pravastatin to crestor  If the pain does not go away, OK to start the pravastatin as this was not the caus  Please continue all other medications as before, including the alleve and tylenol for pain as well  Please have the pharmacy call with any other refills you may need.  Please continue your efforts at being more active, low cholesterol diet, and weight control.  You will be contacted regarding the referral for: Dr Tamala Julian - sports medicine  Please keep your appointments with your specialists as you may have planned  Please go to the LAB in the Basement (turn left off the elevator) for the tests to be done today   You will be contacted by phone if any changes need to be made immediately.  Otherwise, you will receive a letter about your results with an explanation, but please check with MyChart first.  Please remember to sign up for MyChart if you have not done so, as this will be important to you in the future with finding out test results, communicating by private email, and scheduling acute appointments online when needed.

## 2015-04-06 ENCOUNTER — Encounter: Payer: Self-pay | Admitting: Family Medicine

## 2015-04-06 ENCOUNTER — Ambulatory Visit (INDEPENDENT_AMBULATORY_CARE_PROVIDER_SITE_OTHER)
Admission: RE | Admit: 2015-04-06 | Discharge: 2015-04-06 | Disposition: A | Discharge status: Home / Self Care | Payer: Medicare Other | Source: Ambulatory Visit | Attending: Family Medicine | Admitting: Family Medicine

## 2015-04-06 ENCOUNTER — Other Ambulatory Visit (INDEPENDENT_AMBULATORY_CARE_PROVIDER_SITE_OTHER): Payer: Medicare Other

## 2015-04-06 ENCOUNTER — Ambulatory Visit (INDEPENDENT_AMBULATORY_CARE_PROVIDER_SITE_OTHER): Payer: Medicare Other | Admitting: Family Medicine

## 2015-04-06 VITALS — BP 126/76 | HR 90 | Ht 68.0 in | Wt 192.0 lb

## 2015-04-06 DIAGNOSIS — M25511 Pain in right shoulder: Secondary | ICD-10-CM | POA: Diagnosis not present

## 2015-04-06 DIAGNOSIS — M75101 Unspecified rotator cuff tear or rupture of right shoulder, not specified as traumatic: Secondary | ICD-10-CM | POA: Diagnosis not present

## 2015-04-06 DIAGNOSIS — M75100 Unspecified rotator cuff tear or rupture of unspecified shoulder, not specified as traumatic: Secondary | ICD-10-CM | POA: Insufficient documentation

## 2015-04-06 MED ORDER — DICLOFENAC SODIUM 2 % TD SOLN: TRANSDERMAL | Status: DC

## 2015-04-06 NOTE — Progress Notes (Signed)
[name] [address] [place], [place] [address] Phone: [phone] Subjective:    I'm seeing this patient by the request  of:  Cathlean Cower, MD   CC: bilateral shoulder pain right greater than left  [id number] Brandon [name] is a 74 y.o. male coming in with complaint of bilateral shoulder pain. Patient states mostly on the right side. Started approximate 4 weeks ago. Patient does remember a time when he was trying to write leaves and felt a significant amount of pain and weakness immediately in the right shoulder. Started using the left shoulder more. Patient states that over the course of time the right shoulder has started increasing his range of motion as well as some strength. Describes the pain is still as a dull throbbing aching sensation that does wake him up at night. Patient has some difficulty with such things as putting on his belts or reaching above his head. We deny any weakness. Sometimes some radiation down towards his elbow. Patient is right-handed and feels that this is weaker than his contralateral side. Rates. Pain is 7 out of 10. It does wake him up at night.     Past Medical History  Diagnosis Date  . GERD 11/15/2006  . HYPERLIPIDEMIA 11/15/2006  . ERECTILE DYSFUNCTION 11/15/2006  . COLONIC POLYPS, HX OF 11/15/2006  . BENIGN PROSTATIC HYPERTROPHY 11/15/2006  . 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 PSA, INCREASED 11/14/2007  . LOW BACK PAIN 11/14/2007    recurrent  . PERIPHERAL VASCULAR DISEASE 11/25/2009    right carotid 60-79% July 2011  . 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 PARESTHESIA, HANDS 11/23/2008  . Lipoma 11/29/2010  . HYPERTENSION 11/15/2006    borderline  . 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 Inguinal hernia repair  2001  . Prostate biopsy  Fall 2009    S/P-neg-Dr. Jeffie Pollock  . Colonoscopy     . Mass excision  04/06/2011    Procedure: EXCISION MASS;  Surgeon: Judieth Keens, DO;  Location: [facility];  Service: General;  Laterality: Right;  excision right  upper back mass   Social History  Substance Use Topics  . Smoking status: Former Smoker    Quit date: 02/09/1960  . Smokeless tobacco: Never Used  . Alcohol Use: Yes   No Known Allergies Family History  Problem Relation Age of Onset  . Cancer Mother     Lung Cancer  . Depression Mother   . Heart disease Father   . Stroke Father   . Cancer Other     Colon Cancer-Uncle     Past medical history, social, surgical and family history all reviewed in electronic medical record.   Review of Systems: No headache, visual changes, nausea, vomiting, diarrhea, constipation, dizziness, abdominal pain, skin rash, fevers, chills, night sweats, weight loss, swollen lymph nodes, body aches, joint swelling, muscle aches, chest pain, shortness of breath, mood changes.   Objective Blood pressure 126/76, pulse 90, height 5\' 8"  (1.727 m), weight 192 lb (87.091 kg), SpO2 97 %.  General: No apparent distress alert and oriented x3 mood and affect normal, dressed appropriately.  HEENT: Pupils equal, extraocular movements intact  Respiratory: Patient's speak in full sentences and does not appear short of breath  Cardiovascular: No lower extremity edema, non tender, no erythema  Skin: Warm dry intact with no signs of infection or rash  on extremities or on axial skeleton.  Abdomen: Soft nontender  Neuro: Cranial nerves II through XII are intact, neurovascularly intact in all extremities with 2+ DTRs and 2+ pulses.  Lymph: No lymphadenopathy of posterior or anterior cervical chain or axillae bilaterally.  Gait normal with good balance and coordination.  MSK:  Non tender with full range of motion and good stability and symmetric strength and tone of  elbows, wrist, hip, knee and ankles bilaterally.  Shoulder: Right Inspection  reveals no abnormalities, atrophy or asymmetry. Palpation is normal with no tenderness over AC joint or bicipital groove. ROM is full in all planes passively. Rotator cuff strength normal throughout. signs of impingement with positive Neer and Hawkin's tests, but negative empty can sign. Speeds and Yergason's tests positive No labral pathology noted with negative Obrien's, negative clunk and good stability. Normal scapular function observed. No painful arc and no drop arm sign. No apprehension sign Mild impingement on the contralateral side  MSK US performed of: Right This study was ordered, performed, and interpreted by Charlann Boxer D.O.  Shoulder:   Supraspinatus:  Apatient does have a tear. A proximally 60% of the tendon. Articular side. Mild retraction noted.Infraspinatus:  Appears normal on long and transverse views. Significant increase in Doppler flow Subscapularis:  Large near full-thickness tear noted. Retraction of 1.4 cm. Positive bursa noted Teres Minor:  Appears normal on long and transverse views. AC joint:  Moderate arthritis Glenohumeral Joint:  Mild arthritis Glenoid Labrum:  Intact without visualized tears. Biceps Tendon:  Patient does have and partial-thickness tear of the bicep tendon. Positive targetsign noted. Impression: Subacromial bursitis, rotator cuff tear with mild retraction, as well as a bicep tendon tear proximally.  Procedure: Real-time Ultrasound Guided Injection of right glenohumeral joint Device: GE Logiq E  Ultrasound guided injection is preferred based studies that show increased duration, increased effect, greater accuracy, decreased procedural pain, increased response rate with ultrasound guided versus blind injection.  Verbal informed consent obtained.  Time-out conducted.  Noted no overlying erythema, induration, or other signs of local infection.  Skin prepped in a sterile fashion.  Local anesthesia: Topical Ethyl chloride.  With sterile  technique and under real time ultrasound guidance:  Joint visualized.  23g 1  inch needle inserted posterior approach. Pictures taken for needle placement. Patient did have injection of 2 cc of 1% lidocaine, 2 cc of 0.5% Marcaine, and 1.0 cc of Kenalog 40 mg/dL. Completed without difficulty  Pain immediately resolved suggesting accurate placement of the medication.  Advised to call if fevers/chills, erythema, induration, drainage, or persistent bleeding.  Images permanently stored and available for review in the ultrasound unit.  Impression: Technically successful ultrasound guided injection.  [id number]; 15 minutes spent for Therapeutic exercises as stated in above notes.  This included exercises focusing on stretching, strengthening, with significant focus on eccentric aspects. Basic scapular stabilization to include adduction and depression of scapula Scaption, focusing on proper movement and good control Internal and External rotation utilizing a theraband, with elbow tucked at side entire time Rows with theraband  Proper technique shown and discussed handout in great detail with ATC.  All questions were discussed and answered.      Impression and Recommendations:     This case required medical decision making of moderate complexity.

## 2015-04-06 NOTE — Progress Notes (Signed)
Pre visit review using our clinic review tool, if applicable. No additional management support is needed unless otherwise documented below in the visit note. 

## 2015-04-06 NOTE — Assessment & Plan Note (Signed)
Given an injection today. We discussed different treatment options and patient elected to try. We discussed icing regimen. Home exercises. We discussed them patient bicep tendon I discussed also be contributing to a lot of his discomfort. Discussed we need x-rays for further evaluation. Discussed with patient to try to remain active but avoid certain activities. Patient and will come back and see me again in 4 weeks for further evaluation. Patient is very active and we may need to consider advanced imaging patient does not make any significant improvement

## 2015-04-06 NOTE — Patient Instructions (Addendum)
Good to see you.  Ice 20 minutes 2 times daily. Usually after activity and before bed. Exercises 3 times a week.  pennsaid pinkie amount topically 2 times daily as needed.  Vitamin D 2000 IU daily See me again in 4 weeks Lets get xray today as well.  Happy holidays!

## 2015-04-08 ENCOUNTER — Telehealth: Payer: Self-pay | Admitting: Family Medicine

## 2015-04-08 NOTE — Telephone Encounter (Signed)
Pt called state that  Diclofenac Sodium (PENNSAID) 2 % SOLN is not cover by the insurance, pt was wondering if Dr. Tamala Julian can send in something else that will cover to [facility] on [address]. Please help

## 2015-04-08 NOTE — Telephone Encounter (Signed)
Spoke to pt, explained to him that insurance will not cover this med. Left samples at front desk for pt to come by & pick up.

## 2015-04-13 ENCOUNTER — Encounter: Payer: Self-pay | Admitting: Internal Medicine

## 2015-04-14 MED ORDER — ROSUVASTATIN CALCIUM 10 MG PO TABS: 10.0000 mg | ORAL_TABLET | Freq: Every day | ORAL | Status: DC

## 2015-04-14 NOTE — Telephone Encounter (Signed)
Pt is requesting to change cholesterol medication from Pravastatin to generic Crestor due to muscle and joint pain. Please advise in PCP's absence, thanks

## 2015-04-15 DIAGNOSIS — Z85828 Personal history of other malignant neoplasm of skin: Secondary | ICD-10-CM | POA: Diagnosis not present

## 2015-04-15 DIAGNOSIS — Z08 Encounter for follow-up examination after completed treatment for malignant neoplasm: Secondary | ICD-10-CM | POA: Diagnosis not present

## 2015-04-16 ENCOUNTER — Telehealth: Payer: Self-pay | Admitting: Internal Medicine

## 2015-04-16 MED ORDER — ROSUVASTATIN CALCIUM 10 MG PO TABS: 10.0000 mg | ORAL_TABLET | Freq: Every day | ORAL | Status: DC

## 2015-04-16 NOTE — Telephone Encounter (Signed)
Resent rx to [facility]../lmb 

## 2015-04-16 NOTE — Telephone Encounter (Signed)
Can you please resend prescription rosuvastatin (CRESTOR) 10 MG tablet [id number] to [facility] on [address]

## 2015-05-04 ENCOUNTER — Ambulatory Visit (INDEPENDENT_AMBULATORY_CARE_PROVIDER_SITE_OTHER): Payer: Medicare Other | Admitting: Family Medicine

## 2015-05-04 ENCOUNTER — Encounter: Payer: Self-pay | Admitting: Family Medicine

## 2015-05-04 VITALS — BP 136/88 | HR 74 | Ht 68.0 in | Wt 194.0 lb

## 2015-05-04 DIAGNOSIS — M75101 Unspecified rotator cuff tear or rupture of right shoulder, not specified as traumatic: Secondary | ICD-10-CM

## 2015-05-04 DIAGNOSIS — G5602 Carpal tunnel syndrome, left upper limb: Secondary | ICD-10-CM

## 2015-05-04 DIAGNOSIS — M720 Palmar fascial fibromatosis [Dupuytren]: Secondary | ICD-10-CM | POA: Diagnosis not present

## 2015-05-04 NOTE — Progress Notes (Signed)
[name] [address] [place], [place] [address] Phone: [phone] Subjective:      CC: bilateral shoulder pain right greater than left follow-up  [id number] Brandon [name] is a 75 y.o. male coming in with complaint of bilateral shoulder pain. Patient was found to have rotator cuff tear on the right side with a subacromial bursitis. Patient elected to have an injection. Did tolerate the procedure very well. Patient states that the pain has decreased by approximately 80%. Able to do more range of motion. Trying to do the exercises but would like to see if he can make even more improvement. Patient states that he is sleeping comfortably at night. Still some mild discomfort on the contralateral side.   States he is having some complaint of left wrist pain. This is a new problem me. 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 Has not done the exercises. Once to avoid any type of surgical intervention. States that he does have a contraction of the left hand that he is had for years now.    Past Medical History  Diagnosis Date  . GERD 11/15/2006  . HYPERLIPIDEMIA 11/15/2006  . ERECTILE DYSFUNCTION 11/15/2006  . COLONIC POLYPS, HX OF 11/15/2006  . BENIGN PROSTATIC HYPERTROPHY 11/15/2006  . 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 PSA, INCREASED 11/14/2007  . LOW BACK PAIN 11/14/2007    recurrent  . PERIPHERAL VASCULAR DISEASE 11/25/2009    right carotid 60-79% July 2011  . 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 PARESTHESIA, HANDS 11/23/2008  . Lipoma 11/29/2010  . HYPERTENSION 11/15/2006    borderline  . 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 Inguinal hernia repair  2001  . Prostate biopsy  Fall 2009    S/P-neg-Dr. Jeffie Pollock  . Colonoscopy    . Mass excision  04/06/2011    Procedure: EXCISION MASS;  Surgeon: Judieth Keens, DO;  Location: [facility];  Service: General;  Laterality: Right;  excision right  upper back mass   Social History  Substance Use Topics  . Smoking status: Former Smoker    Quit date: 02/09/1960  . Smokeless tobacco: Never Used  . Alcohol Use: Yes   No Known Allergies Family History  Problem Relation Age of Onset  . Cancer Mother     Lung Cancer  . Depression Mother   . Heart disease Father   . Stroke Father   . Cancer Other     Colon Cancer-Uncle     Past medical history, social, surgical and family history all reviewed in electronic medical record.   Review of Systems: No headache, visual changes, nausea, vomiting, diarrhea, constipation, dizziness, abdominal pain, skin rash, fevers, chills, night sweats, weight loss, swollen lymph nodes, body aches, joint swelling, muscle aches, chest pain, shortness of breath, mood changes.   Objective Blood pressure 136/88, pulse 74, height 5\' 8"  (1.727 m), weight 194 lb (87.998 kg), SpO2 97 %.  General: No apparent distress alert and oriented x3 mood and affect normal, dressed appropriately.  HEENT: Pupils equal, extraocular movements intact  Respiratory: Patient's speak in full sentences and does not appear short of breath  Cardiovascular: No lower extremity edema, non tender, no erythema  Skin: Warm dry intact with no signs of infection or rash on extremities or on axial skeleton.  Abdomen: Soft nontender  Neuro: Cranial nerves II through  XII are intact, neurovascularly intact in all extremities with 2+ DTRs and 2+ pulses.  Lymph: No lymphadenopathy of posterior or anterior cervical chain or axillae bilaterally.  Gait normal with good balance and coordination.  MSK:  Non tender with full range of motion and good stability and symmetric strength and tone of  elbows, wrist, hip, knee and ankles bilaterally. Significant arthritic changes of multiple joints. Shoulder: Right Inspection reveals no abnormalities,  atrophy or asymmetry. Palpation is normal with no tenderness over AC joint or bicipital groove. ROM is full in all planes passively. Rotator cuff strength normal throughout. Mild impingement bilaterally Speeds and Yergason's tests positive No labral pathology noted with negative Obrien's, negative clunk and good stability. Normal scapular function observed. No painful arc and no drop arm sign. 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       Impression and Recommendations:     This case required medical decision making of moderate complexity.

## 2015-05-04 NOTE — Progress Notes (Signed)
Pre visit review using our clinic review tool, if applicable. No additional management support is needed unless otherwise documented below in the visit note. 

## 2015-05-04 NOTE — Assessment & Plan Note (Signed)
Has responded fairly well to conservative therapy. Referred to formal physical therapy which I think will be beneficial. Encouraged patient continue topical anti-inflammatories home exercises and icing. Prescription given again for the topical anti-inflammatory. We discussed icing regimen. Patient will come back and see me again in 4-6 weeks.

## 2015-05-04 NOTE — Assessment & Plan Note (Signed)
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 No significant weakness but I do think patient has chronic numbness. Discussed the possibility of injection. Patient would like to hold off at this time. Discussed topical anti-inflammatories in this area as well. Discussed over-the-counter natural supplementation. Patient will come back and see me again in 4 weeks or sooner if any worsening symptoms.

## 2015-05-04 NOTE — Patient Instructions (Signed)
Good to see you You are doing great  We will get you in with PT and they will help ICe at night OK to increase weight slowly 1-2# every 2 weeks If carpal tunnel gets worse call me and we can try an injection pennsaid pinkie amount topically 2 times daily as needed.  See me again in 6 weeks.  Happy New Year!

## 2015-05-06 DIAGNOSIS — M25512 Pain in left shoulder: Secondary | ICD-10-CM | POA: Diagnosis not present

## 2015-05-06 DIAGNOSIS — M25511 Pain in right shoulder: Secondary | ICD-10-CM | POA: Diagnosis not present

## 2015-05-07 DIAGNOSIS — R972 Elevated prostate specific antigen [PSA]: Secondary | ICD-10-CM | POA: Diagnosis not present

## 2015-05-07 DIAGNOSIS — N400 Enlarged prostate without lower urinary tract symptoms: Secondary | ICD-10-CM | POA: Diagnosis not present

## 2015-05-13 DIAGNOSIS — M25511 Pain in right shoulder: Secondary | ICD-10-CM | POA: Diagnosis not present

## 2015-05-13 DIAGNOSIS — M25512 Pain in left shoulder: Secondary | ICD-10-CM | POA: Diagnosis not present

## 2015-05-20 DIAGNOSIS — M25511 Pain in right shoulder: Secondary | ICD-10-CM | POA: Diagnosis not present

## 2015-05-20 DIAGNOSIS — M25512 Pain in left shoulder: Secondary | ICD-10-CM | POA: Diagnosis not present

## 2015-05-27 DIAGNOSIS — M25512 Pain in left shoulder: Secondary | ICD-10-CM | POA: Diagnosis not present

## 2015-05-27 DIAGNOSIS — M25511 Pain in right shoulder: Secondary | ICD-10-CM | POA: Diagnosis not present

## 2015-06-25 ENCOUNTER — Ambulatory Visit (INDEPENDENT_AMBULATORY_CARE_PROVIDER_SITE_OTHER): Payer: Medicare Other | Admitting: Internal Medicine

## 2015-06-25 ENCOUNTER — Encounter: Payer: Self-pay | Admitting: Internal Medicine

## 2015-06-25 ENCOUNTER — Ambulatory Visit: Payer: Medicare Other | Admitting: Family Medicine

## 2015-06-25 VITALS — BP 132/84 | HR 64 | Temp 97.9°F | Resp 20 | Wt 193.0 lb

## 2015-06-25 DIAGNOSIS — Z0000 Encounter for general adult medical examination without abnormal findings: Secondary | ICD-10-CM

## 2015-06-25 DIAGNOSIS — I10 Essential (primary) hypertension: Secondary | ICD-10-CM

## 2015-06-25 DIAGNOSIS — E785 Hyperlipidemia, unspecified: Secondary | ICD-10-CM

## 2015-06-25 DIAGNOSIS — R972 Elevated prostate specific antigen [PSA]: Secondary | ICD-10-CM | POA: Diagnosis not present

## 2015-06-25 NOTE — Patient Instructions (Signed)

## 2015-06-25 NOTE — Progress Notes (Signed)
Subjective:    Patient ID: Brandon [name], male    DOB: [birth year], 75 y.o.   MRN: [id number]  HPI  Here for wellness and f/u;  Overall doing ok;  Pt denies Chest pain, worsening SOB, DOE, wheezing, orthopnea, PND, worsening LE edema, palpitations, dizziness or syncope.  Pt denies neurological change such as new headache, facial or extremity weakness.  Pt denies polydipsia, polyuria, or low sugar symptoms. Pt states overall good compliance with treatment and medications, good tolerability, and has been trying to follow appropriate diet.  Pt denies worsening depressive symptoms, suicidal ideation or panic. No fever, night sweats, wt loss, loss of appetite, or other constitutional symptoms.  Pt states good ability with ADL's, has low fall risk, home safety reviewed and adequate, no other significant changes in hearing or vision, and only occasionally active with exercise.  Has seen Dr Tamala Julian for bilat shoulder pain, improved aftr cortisone to right, and PT for the left.   Pleased as he was able to play golf recently.  Has also seen Dr Davis/urology, on finasteride, did not need PSA last visit. Denies urinary symptoms such as dysuria, frequency, urgency, flank pain, hematuria or n/v, fever, chills.  Seems to tolerate the crestor better than prior pravastatin, asks for lab f/u, no myalgias or joint issues.  Sees derm also every 6 mo. Past Medical History  Diagnosis Date  . GERD 11/15/2006  . HYPERLIPIDEMIA 11/15/2006  . ERECTILE DYSFUNCTION 11/15/2006  . COLONIC POLYPS, HX OF 11/15/2006  . BENIGN PROSTATIC HYPERTROPHY 11/15/2006  . 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 PSA, INCREASED 11/14/2007  . LOW BACK PAIN 11/14/2007    recurrent  . PERIPHERAL VASCULAR DISEASE 11/25/2009    right carotid 60-79% July 2011  . 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 PARESTHESIA, HANDS 11/23/2008  . Lipoma 11/29/2010  . HYPERTENSION 11/15/2006   borderline  . 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 Inguinal hernia repair  2001  . Prostate biopsy  Fall 2009    S/P-neg-Dr. Jeffie Pollock  . Colonoscopy    . Mass excision  04/06/2011    Procedure: EXCISION MASS;  Surgeon: Judieth Keens, DO;  Location: [facility];  Service: General;  Laterality: Right;  excision right  upper back mass    reports that he quit smoking about 55 years ago. He has never used smokeless tobacco. He reports that he drinks alcohol. He reports that he does not use illicit drugs. family history includes Cancer in his mother and other; Depression in his mother; Heart disease in his father; Stroke in his father. No Known Allergies Current Outpatient Prescriptions on File Prior to Visit  Medication Sig Dispense Refill  . Acetaminophen (TYLENOL ARTHRITIS PAIN PO) Take by mouth daily.    Marland Kitchen amLODipine (NORVASC) 5 MG tablet TAKE ONE TABLET BY MOUTH DAILY. 90 tablet 3  . aspirin 81 MG tablet Take 81 mg by mouth daily.      . calcium & magnesium carbonates (MYLANTA) 311-232 MG per tablet Take 1 tablet by mouth daily.    . Cinnamon 500 MG capsule Take 500 mg by mouth 2 (two) times daily.      . Diclofenac Sodium (PENNSAID) 2 % SOLN Apply fingertip amount to affected area twice daily 112 g 3  . finasteride (PROSCAR) 5 MG tablet Take 5 mg by mouth daily.      . Multiple  Vitamin (MULTIVITAMIN) tablet Take 1 tablet by mouth daily.      . rosuvastatin (CRESTOR) 10 MG tablet Take 1 tablet (10 mg total) by mouth daily. 30 tablet 5  . vitamin C (ASCORBIC ACID) 500 MG tablet Take 500 mg by mouth daily.    . vitamin E 400 UNIT capsule Take 400 Units by mouth daily.     No current facility-administered medications on file prior to visit.   Review of Systems Constitutional: Negative for increased diaphoresis, other activity, appetite or siginficant weight change other than noted HENT: Negative for worsening  hearing loss, ear pain, facial swelling, mouth sores and neck stiffness.   Eyes: Negative for other worsening pain, redness or visual disturbance.  Respiratory: Negative for shortness of breath and wheezing  Cardiovascular: Negative for chest pain and palpitations.  Gastrointestinal: Negative for diarrhea, blood in stool, abdominal distention or other pain Genitourinary: Negative for hematuria, flank pain or change in urine volume.  Musculoskeletal: Negative for myalgias or other joint complaints.  Skin: Negative for color change and wound or drainage.  Neurological: Negative for syncope and numbness. other than noted Hematological: Negative for adenopathy. or other swelling Psychiatric/Behavioral: Negative for hallucinations, SI, self-injury, decreased concentration or other worsening agitation.      Objective:   Physical Exam BP 132/84 mmHg  Pulse 64  Temp(Src) 97.9 F (36.6 C) (Oral)  Resp 20  Wt 193 lb (87.544 kg)  SpO2 96% VS noted,  Constitutional: Pt is oriented to person, place, and time. Appears well-developed and well-nourished, in no significant distress Head: Normocephalic and atraumatic.  Right Ear: External ear normal.  Left Ear: External ear normal.  Nose: Nose normal.  Mouth/Throat: Oropharynx is clear and moist.  Eyes: Conjunctivae and EOM are normal. Pupils are equal, round, and reactive to light.  Neck: Normal range of motion. Neck supple. No JVD present. No tracheal deviation present or significant neck LA or mass Cardiovascular: Normal rate, regular rhythm, normal heart sounds and intact distal pulses.   Pulmonary/Chest: Effort normal and breath sounds without rales or wheezing  Abdominal: Soft. Bowel sounds are normal. NT. No HSM  Musculoskeletal: Normal range of motion. Exhibits no edema.  Lymphadenopathy:  Has no cervical adenopathy.  Neurological: Pt is alert and oriented to person, place, and time. Pt has normal reflexes. No cranial nerve deficit. Motor  grossly intact Skin: Skin is warm and dry. No rash noted.  Psychiatric:  Has nervousmood and affect. Behavior is normal.     Assessment & Plan:

## 2015-06-25 NOTE — Progress Notes (Signed)
Pre visit review using our clinic review tool, if applicable. No additional management support is needed unless otherwise documented below in the visit note. 

## 2015-06-26 NOTE — Assessment & Plan Note (Signed)
Asympt, pt reqeusts f/u psa,  to f/u any worsening symptoms or concerns

## 2015-06-26 NOTE — Assessment & Plan Note (Signed)

## 2015-06-26 NOTE — Assessment & Plan Note (Signed)
Lab Results  Component Value Date   LDLCALC 78 12/18/2014   stable overall by history and exam, recent data reviewed with pt, and pt to continue medical treatment as before,  to f/u any worsening symptoms or concerns, for low chol diet, cont statin

## 2015-06-26 NOTE — Assessment & Plan Note (Signed)
stable overall by history and exam, recent data reviewed with pt, and pt to continue medical treatment as before,  to f/u any worsening symptoms or concerns BP Readings from Last 3 Encounters:  06/25/15 132/84  05/04/15 136/88  04/06/15 126/76

## 2015-06-28 ENCOUNTER — Other Ambulatory Visit (INDEPENDENT_AMBULATORY_CARE_PROVIDER_SITE_OTHER): Payer: Medicare Other

## 2015-06-28 DIAGNOSIS — Z0000 Encounter for general adult medical examination without abnormal findings: Secondary | ICD-10-CM | POA: Diagnosis not present

## 2015-06-28 LAB — CBC WITH DIFFERENTIAL/PLATELET
BASOS ABS: 0 10*3/uL (ref 0.0–0.1)
Basophils Relative: 0.6 % (ref 0.0–3.0)
EOS ABS: 0.2 10*3/uL (ref 0.0–0.7)
Eosinophils Relative: 3 % (ref 0.0–5.0)
HCT: 46.7 % (ref 39.0–52.0)
Hemoglobin: 15.8 g/dL (ref 13.0–17.0)
LYMPHS ABS: 1.7 10*3/uL (ref 0.7–4.0)
Lymphocytes Relative: 23.4 % (ref 12.0–46.0)
MCHC: 33.9 g/dL (ref 30.0–36.0)
MCV: 89.2 fl (ref 78.0–100.0)
MONO ABS: 0.5 10*3/uL (ref 0.1–1.0)
Monocytes Relative: 7.3 % (ref 3.0–12.0)
NEUTROS ABS: 4.8 10*3/uL (ref 1.4–7.7)
NEUTROS PCT: 65.7 % (ref 43.0–77.0)
PLATELETS: 263 10*3/uL (ref 150.0–400.0)
RBC: 5.23 Mil/uL (ref 4.22–5.81)
RDW: 14 % (ref 11.5–15.5)
WBC: 7.2 10*3/uL (ref 4.0–10.5)

## 2015-06-28 LAB — HEPATIC FUNCTION PANEL
ALK PHOS: 52 U/L (ref 39–117)
ALT: 16 U/L (ref 0–53)
AST: 15 U/L (ref 0–37)
Albumin: 4.3 g/dL (ref 3.5–5.2)
BILIRUBIN DIRECT: 0.2 mg/dL (ref 0.0–0.3)
BILIRUBIN TOTAL: 0.7 mg/dL (ref 0.2–1.2)
Total Protein: 6.9 g/dL (ref 6.0–8.3)

## 2015-06-28 LAB — URINALYSIS, ROUTINE W REFLEX MICROSCOPIC
BILIRUBIN URINE: NEGATIVE
Hgb urine dipstick: NEGATIVE
KETONES UR: NEGATIVE
LEUKOCYTES UA: NEGATIVE
Nitrite: NEGATIVE
PH: 7 (ref 5.0–8.0)
SPECIFIC GRAVITY, URINE: 1.01 (ref 1.000–1.030)
TOTAL PROTEIN, URINE-UPE24: NEGATIVE
UROBILINOGEN UA: 0.2 (ref 0.0–1.0)
Urine Glucose: NEGATIVE
WBC, UA: NONE SEEN (ref 0–?)

## 2015-06-28 LAB — BASIC METABOLIC PANEL
BUN: 23 mg/dL (ref 6–23)
CALCIUM: 9.8 mg/dL (ref 8.4–10.5)
CO2: 27 meq/L (ref 19–32)
CREATININE: 1.15 mg/dL (ref 0.40–1.50)
Chloride: 105 mEq/L (ref 96–112)
GFR: 65.78 mL/min (ref >60.00)
GLUCOSE: 99 mg/dL (ref 70–99)
Potassium: 4.3 mEq/L (ref 3.5–5.1)
SODIUM: 139 meq/L (ref 135–145)

## 2015-06-28 LAB — LIPID PANEL
CHOLESTEROL: 142 mg/dL (ref 0–200)
HDL: 68 mg/dL (ref >39.00)
LDL Cholesterol: 61 mg/dL (ref 0–99)
NONHDL: 73.62
Total CHOL/HDL Ratio: 2
Triglycerides: 62 mg/dL (ref 0.0–149.0)
VLDL: 12.4 mg/dL (ref 0.0–40.0)

## 2015-06-28 LAB — TSH: TSH: 0.77 u[IU]/mL (ref 0.35–4.50)

## 2015-06-28 LAB — PSA: PSA: 1.91 ng/mL (ref 0.10–4.00)

## 2015-07-05 ENCOUNTER — Encounter: Payer: Self-pay | Admitting: Family Medicine

## 2015-07-05 ENCOUNTER — Ambulatory Visit (INDEPENDENT_AMBULATORY_CARE_PROVIDER_SITE_OTHER): Payer: Medicare Other | Admitting: Family Medicine

## 2015-07-05 ENCOUNTER — Other Ambulatory Visit (INDEPENDENT_AMBULATORY_CARE_PROVIDER_SITE_OTHER): Payer: Medicare Other

## 2015-07-05 VITALS — BP 138/80 | HR 59 | Wt 193.0 lb

## 2015-07-05 DIAGNOSIS — M25512 Pain in left shoulder: Secondary | ICD-10-CM

## 2015-07-05 DIAGNOSIS — G5602 Carpal tunnel syndrome, left upper limb: Secondary | ICD-10-CM | POA: Diagnosis not present

## 2015-07-05 DIAGNOSIS — M7552 Bursitis of left shoulder: Secondary | ICD-10-CM | POA: Insufficient documentation

## 2015-07-05 NOTE — Progress Notes (Signed)
[name] [address] [place], [place] [address] Phone: [phone] Subjective:      CC: bilateral shoulder pain  Follow-up with bilateral wrist follow-up  [id number] Brandon [name] is a 75 y.o. male coming in with complaint of bilateral shoulder pain. Patient was found to have rotator cuff tear on the right side with a subacromial bursitis. Patient elected to have an injection.  Patient was doing approximately 80% better at follow-up. Was starting to increase his activity. Last injections were greater than 3 months ago. Patient statesRight shoulder seems to be doing very well. Continues to have pain on the left shoulder. Waking him up at night. Patient states that it feels very similar to his other shoulder. Without a was compensating but patient states it has not gone away.   States he is having some complaint of left wrist pain. This is a new problem me. 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 Patient was given exercises and encouraged to wear braces on a more regular basis. Patient given a trial of topical anti-inflammatory as well as prescription. Patient statesThe numbness seems to be more continuous at this time. Worse in the morning. Seems to be through the thumb through ring fingers. Sometimes feels some mild weakness.    Past Medical History  Diagnosis Date  . GERD 11/15/2006  . HYPERLIPIDEMIA 11/15/2006  . ERECTILE DYSFUNCTION 11/15/2006  . COLONIC POLYPS, HX OF 11/15/2006  . BENIGN PROSTATIC HYPERTROPHY 11/15/2006  . 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 PSA, INCREASED 11/14/2007  . LOW BACK PAIN 11/14/2007    recurrent  . PERIPHERAL VASCULAR DISEASE 11/25/2009    right carotid 60-79% July 2011  . 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 PARESTHESIA, HANDS 11/23/2008  . Lipoma 11/29/2010  . HYPERTENSION 11/15/2006    borderline  . 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 Inguinal hernia repair  2001  . Prostate biopsy  Fall 2009    S/P-neg-Dr. Jeffie Pollock  . Colonoscopy    . Mass excision  04/06/2011    Procedure: EXCISION MASS;  Surgeon: Judieth Keens, DO;  Location: [facility];  Service: General;  Laterality: Right;  excision right  upper back mass   Social History  Substance Use Topics  . Smoking status: Former Smoker    Quit date: 02/09/1960  . Smokeless tobacco: Never Used  . Alcohol Use: Yes   No Known Allergies Family History  Problem Relation Age of Onset  . Cancer Mother     Lung Cancer  . Depression Mother   . Heart disease Father   . Stroke Father   . Cancer Other     Colon Cancer-Uncle     Past medical history, social, surgical and family history all reviewed in electronic medical record.   Review of Systems: No headache, visual changes, nausea, vomiting, diarrhea, constipation, dizziness, abdominal pain, skin rash, fevers, chills, night sweats, weight loss, swollen lymph nodes, body aches, joint swelling, muscle aches, chest pain, shortness of breath, mood changes.   Objective Blood pressure 138/80, pulse 59, weight 193 lb (87.544 kg), SpO2 99 %.  General: No apparent distress alert and oriented x3 mood and affect normal, dressed appropriately.  HEENT: Pupils equal, extraocular movements intact  Respiratory: Patient's speak in full sentences and does not appear short of breath  Cardiovascular: No lower extremity edema, non tender,  no erythema  Skin: Warm dry intact with no signs of infection or rash on extremities or on axial skeleton.  Abdomen: Soft nontender  Neuro: Cranial nerves II through XII are intact, neurovascularly intact in all extremities with 2+ DTRs and 2+ pulses.  Lymph: No lymphadenopathy of posterior or anterior cervical chain or axillae bilaterally.  Gait normal with good balance and coordination.  MSK:  Non tender with full range  of motion and good stability and symmetric strength and tone of  elbows, wrist, hip, knee and ankles bilaterally. Significant arthritic changes of multiple joints.  Shoulder: Left Inspection reveals no abnormalities, atrophy or asymmetry. Palpation is normal with no tenderness over AC joint or bicipital groove. ROM is full in all planes passively. Rotator cuff strength normal throughout. Positive impingement Speeds and Yergason's tests positive No labral pathology noted with negative Obrien's, negative clunk and good stability. Normal scapular function observed. No painful arc and no drop arm sign. No apprehension sign Mild impingement on the contralateral side  Wrist exam shows the patient does have severe arthritic changes and positive Tinel sign left  Procedure: Real-time Ultrasound Guided Injection of left glenohumeral joint Device: GE Logiq E  Ultrasound guided injection is preferred based studies that show increased duration, increased effect, greater accuracy, decreased procedural pain, increased response rate with ultrasound guided versus blind injection.  Verbal informed consent obtained.  Time-out conducted.  Noted no overlying erythema, induration, or other signs of local infection.  Skin prepped in a sterile fashion.  Local anesthesia: Topical Ethyl chloride.  With sterile technique and under real time ultrasound guidance:  Joint visualized.  23g 1  inch needle inserted posterior approach. Pictures taken for needle placement. Patient did have injection of 2 cc of 1% lidocaine, 2 cc of 0.5% Marcaine, and 1cc of Kenalog 40 mg/dL. Completed without difficulty  Pain immediately resolved suggesting accurate placement of the medication.  Advised to call if fevers/chills, erythema, induration, drainage, or persistent bleeding.  Images permanently stored and available for review in the ultrasound unit.  Impression: Technically successful ultrasound guided injection.   Procedure:  Real-time Ultrasound Guided Injection of left carpal tunnel Device: GE Logiq E  Ultrasound guided injection is preferred based studies that show increased duration, increased effect, greater accuracy, decreased procedural pain, increased response rate with ultrasound guided versus blind injection.  Verbal informed consent obtained.  Time-out conducted.  Noted no overlying erythema, induration, or other signs of local infection.  Skin prepped in a sterile fashion.  Local anesthesia: Topical Ethyl chloride.  With sterile technique and under real time ultrasound guidance:  median nerve visualized.  23g 5/8 inch needle inserted distal to proximal approach into nerve sheath. Pictures taken nfor needle placement. Patient did have injection of 2 cc of 1% lidocaine, 1 cc of 0.5% Marcaine, and 1 cc of Kenalog 40 mg/dL. Completed without difficulty  Pain immediately resolved suggesting accurate placement of the medication.  Advised to call if fevers/chills, erythema, induration, drainage, or persistent bleeding.  Images permanently stored and available for review in the ultrasound unit.  Impression: Technically successful ultrasound guided injection.         Impression and Recommendations:     This case required medical decision making of moderate complexity.

## 2015-07-05 NOTE — Assessment & Plan Note (Signed)
Patient given injection today. Tolerated the procedure well. We discussed icing regimen and home exercises. We discussed which activities to do a which was to avoid. Patient will do some bracing at night and topical antibiotic laboratories. Patient come back and see me again in 4 weeks for further evaluation.

## 2015-07-05 NOTE — Assessment & Plan Note (Signed)
Given an injection today and tolerated the procedure very well. We discussed icing regimen, home exercises, which activities to do an which was to avoid. Patient will try topical anti-inflammatories. Patient come back and see me again in 4 weeks to make sure he is responding to the exercises. If continuing pain we may need to consider advanced imaging or formal physical therapy.

## 2015-07-05 NOTE — Patient Instructions (Addendum)
Good to see you  Brandon [name] is your friend when hurting and would do for 10-20 minutes.  Stay active overall.  Lets drop the exercises to only 1 time daily  Injected the left shoulder and the carpal tunnel on the left  Keep doing what you are doing See me again in 4-6 weeks if not perfect

## 2015-07-06 ENCOUNTER — Ambulatory Visit: Payer: Medicare Other | Admitting: Family Medicine

## 2015-07-08 DIAGNOSIS — H35033 Hypertensive retinopathy, bilateral: Secondary | ICD-10-CM | POA: Diagnosis not present

## 2015-07-15 ENCOUNTER — Encounter: Payer: Self-pay | Admitting: Internal Medicine

## 2015-07-16 ENCOUNTER — Other Ambulatory Visit: Payer: Self-pay | Admitting: Internal Medicine

## 2015-07-22 ENCOUNTER — Encounter: Payer: Self-pay | Admitting: Internal Medicine

## 2015-07-22 NOTE — Telephone Encounter (Signed)
Corinne to send lab results to pt from feb 27 please

## 2015-07-26 ENCOUNTER — Ambulatory Visit (INDEPENDENT_AMBULATORY_CARE_PROVIDER_SITE_OTHER): Payer: Medicare Other | Admitting: Family Medicine

## 2015-07-26 ENCOUNTER — Encounter: Payer: Self-pay | Admitting: Family Medicine

## 2015-07-26 VITALS — BP 128/74 | HR 73 | Ht 68.0 in | Wt 197.0 lb

## 2015-07-26 DIAGNOSIS — M75101 Unspecified rotator cuff tear or rupture of right shoulder, not specified as traumatic: Secondary | ICD-10-CM

## 2015-07-26 DIAGNOSIS — M25511 Pain in right shoulder: Secondary | ICD-10-CM | POA: Diagnosis not present

## 2015-07-26 NOTE — Progress Notes (Signed)
Pre visit review using our clinic review tool, if applicable. No additional management support is needed unless otherwise documented below in the visit note. 

## 2015-07-26 NOTE — Patient Instructions (Signed)
I am so sorry for the pain .  You have a rotator cuff tear.  Ice 20 minutes 2 times daily. Usually after activity and before bed. pennsaid pinkie amount topically 2 times daily as needed.  We will get MRI and if a tear and not a lot of arthritis I would like you to see a Psychologist, sport and exercise.  I will write you in my chart after the MRI and we will discuss options.  Dr. Malena Catholic is who I would consider for surgery.

## 2015-07-26 NOTE — Assessment & Plan Note (Signed)
I believe the patient's right rotator cuff tear now is complete tear. Patient did have an injection back in December and was increasing his strength and range of motion. I feel that patient now though has significant weakness. MRI ordered to further evaluate tear and make sure there is no significant retraction. In addition of this to reevaluate evaluate any type of arthritis. Even though patient is in the seventh decade of life if mild arthritic changes are only noted I do think he is a candidate for rotator cuff repair. We discussed that this could change management drastically. Patient will have the MRI and then come back and see me. At that time depending on findings we'll either consider injection and formal physical therapy versus referral for surgical intervention. Discuss different pain medications which patient declined. Patient will continue with his regular regimen including the topical anti-inflammatories. Spent  25 minutes with patient face-to-face and had greater than 50% of counseling including as described above in assessment and plan.

## 2015-07-26 NOTE — Progress Notes (Signed)
[name] [address] [place], [place] [address] Phone: [phone] Subjective:    I'm seeing this patient by the request  of:  Cathlean Cower, MD   CC: bilateral shoulder pain right greater than left  [id number] Brandon [name] is a 75 y.o. male coming in with complaint of bilateral shoulder pain.  Patient had been doing relatively well. Patient was found to have a 60% articular side rotator cuff tear. Patient states that with the exercises he was strengthening. States that the pain was less. Patient was doing yard work though when trying to reach overhead gait and when he did this see felt a crunching sensation heard an audible pop. This happened 2 days ago. Since then he is been unable to actually lift his arm. Patient states that the pain seems to be more severe as well. Patient states that though no matter what he does he has easy other hand to actually raise his arm. Patient has had imaging previously with an x-ray showing only very mild arthritic changes. Patient states that he is concerned and frustrated because he was making improvement and now seems to be significantly worse.     Past Medical History  Diagnosis Date  . GERD 11/15/2006  . HYPERLIPIDEMIA 11/15/2006  . ERECTILE DYSFUNCTION 11/15/2006  . COLONIC POLYPS, HX OF 11/15/2006  . BENIGN PROSTATIC HYPERTROPHY 11/15/2006  . 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 PSA, INCREASED 11/14/2007  . LOW BACK PAIN 11/14/2007    recurrent  . PERIPHERAL VASCULAR DISEASE 11/25/2009    right carotid 60-79% July 2011  . 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 PARESTHESIA, HANDS 11/23/2008  . Lipoma 11/29/2010  . HYPERTENSION 11/15/2006    borderline  . 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 Inguinal hernia repair  2001  . Prostate biopsy  Fall 2009    S/P-neg-Dr. Jeffie Pollock  .  Colonoscopy    . Mass excision  04/06/2011    Procedure: EXCISION MASS;  Surgeon: Judieth Keens, DO;  Location: [facility];  Service: General;  Laterality: Right;  excision right  upper back mass   Social History  Substance Use Topics  . Smoking status: Former Smoker    Quit date: 02/09/1960  . Smokeless tobacco: Never Used  . Alcohol Use: Yes   No Known Allergies Family History  Problem Relation Age of Onset  . Cancer Mother     Lung Cancer  . Depression Mother   . Heart disease Father   . Stroke Father   . Cancer Other     Colon Cancer-Uncle     Past medical history, social, surgical and family history all reviewed in electronic medical record.   Review of Systems: No headache, visual changes, nausea, vomiting, diarrhea, constipation, dizziness, abdominal pain, skin rash, fevers, chills, night sweats, weight loss, swollen lymph nodes, body aches, joint swelling, muscle aches, chest pain, shortness of breath, mood changes.   Objective Blood pressure 128/74, pulse 73, weight 197 lb (89.359 kg), SpO2 97 %.  General: No apparent distress alert and oriented x3 mood and affect normal, dressed appropriately.  HEENT: Pupils equal, extraocular movements intact  Respiratory: Patient's speak in full sentences and does not appear short of breath  Cardiovascular: No lower extremity edema, non tender, no erythema  Skin: Warm dry intact with no signs of infection or rash on extremities or on  axial skeleton.  Abdomen: Soft nontender  Neuro: Cranial nerves II through XII are intact, neurovascularly intact in all extremities with 2+ DTRs and 2+ pulses.  Lymph: No lymphadenopathy of posterior or anterior cervical chain or axillae bilaterally.  Gait normal with good balance and coordination.  MSK:  Non tender with full range of motion and good stability and symmetric strength and tone of  elbows, wrist, hip, knee and ankles bilaterally.  Shoulder: Right Inspection reveals a  patient shoulder is now high riding which is different than previous exam. Diffuse mild tenderness of the shoulder Range of motion passively seems to be full but patient does have pain. Actively only forward flexion to 100. Rotator cuff strength 3 out of 5 compared to the contralateral side. 2 out of 5 pain with the infraspinatus signs of impingement with positive Neer and Hawkin's tests, but negative empty can sign. Speeds and Yergason's tests positive patient is having defect of the bicep tendon that has been previously noted. Positive labral pathology Normal scapular function observed. No painful arc and no drop arm sign. No apprehension sign Mild impingement on the contralateral side      Impression and Recommendations:     This case required medical decision making of moderate complexity.

## 2015-07-27 ENCOUNTER — Other Ambulatory Visit: Payer: Self-pay | Admitting: Family Medicine

## 2015-07-27 DIAGNOSIS — T1590XA Foreign body on external eye, part unspecified, unspecified eye, initial encounter: Secondary | ICD-10-CM

## 2015-08-02 ENCOUNTER — Ambulatory Visit: Payer: Medicare Other | Admitting: Family Medicine

## 2015-08-04 ENCOUNTER — Ambulatory Visit
Admission: RE | Admit: 2015-08-04 | Discharge: 2015-08-04 | Disposition: A | Discharge status: Home / Self Care | Payer: Medicare Other | Source: Ambulatory Visit | Attending: Family Medicine | Admitting: Family Medicine

## 2015-08-04 DIAGNOSIS — M75111 Incomplete rotator cuff tear or rupture of right shoulder, not specified as traumatic: Secondary | ICD-10-CM | POA: Diagnosis not present

## 2015-08-04 DIAGNOSIS — Z01818 Encounter for other preprocedural examination: Secondary | ICD-10-CM | POA: Diagnosis not present

## 2015-08-04 DIAGNOSIS — T1590XA Foreign body on external eye, part unspecified, unspecified eye, initial encounter: Secondary | ICD-10-CM

## 2015-08-04 DIAGNOSIS — M25511 Pain in right shoulder: Secondary | ICD-10-CM

## 2015-08-05 ENCOUNTER — Encounter: Payer: Self-pay | Admitting: Family Medicine

## 2015-08-20 DIAGNOSIS — M75121 Complete rotator cuff tear or rupture of right shoulder, not specified as traumatic: Secondary | ICD-10-CM | POA: Diagnosis not present

## 2015-08-25 DIAGNOSIS — M25511 Pain in right shoulder: Secondary | ICD-10-CM | POA: Diagnosis not present

## 2015-08-30 DIAGNOSIS — M25511 Pain in right shoulder: Secondary | ICD-10-CM | POA: Diagnosis not present

## 2015-09-02 DIAGNOSIS — M25511 Pain in right shoulder: Secondary | ICD-10-CM | POA: Diagnosis not present

## 2015-09-30 DIAGNOSIS — M25511 Pain in right shoulder: Secondary | ICD-10-CM | POA: Diagnosis not present

## 2015-10-01 DIAGNOSIS — M75121 Complete rotator cuff tear or rupture of right shoulder, not specified as traumatic: Secondary | ICD-10-CM | POA: Diagnosis not present

## 2015-10-07 DIAGNOSIS — M25511 Pain in right shoulder: Secondary | ICD-10-CM | POA: Diagnosis not present

## 2015-10-22 ENCOUNTER — Other Ambulatory Visit: Payer: Self-pay | Admitting: Internal Medicine

## 2015-11-05 DIAGNOSIS — N401 Enlarged prostate with lower urinary tract symptoms: Secondary | ICD-10-CM | POA: Diagnosis not present

## 2015-11-05 DIAGNOSIS — R972 Elevated prostate specific antigen [PSA]: Secondary | ICD-10-CM | POA: Diagnosis not present

## 2015-11-06 ENCOUNTER — Other Ambulatory Visit: Payer: Self-pay | Admitting: Internal Medicine

## 2015-11-29 ENCOUNTER — Other Ambulatory Visit: Payer: Self-pay | Admitting: Internal Medicine

## 2015-12-25 ENCOUNTER — Other Ambulatory Visit: Payer: Self-pay | Admitting: Internal Medicine

## 2016-01-05 ENCOUNTER — Ambulatory Visit (INDEPENDENT_AMBULATORY_CARE_PROVIDER_SITE_OTHER): Payer: Medicare Other | Admitting: Internal Medicine

## 2016-01-05 ENCOUNTER — Encounter: Payer: Self-pay | Admitting: Internal Medicine

## 2016-01-05 ENCOUNTER — Ambulatory Visit (INDEPENDENT_AMBULATORY_CARE_PROVIDER_SITE_OTHER)
Admission: RE | Admit: 2016-01-05 | Discharge: 2016-01-05 | Disposition: A | Discharge status: Home / Self Care | Payer: Medicare Other | Source: Ambulatory Visit | Attending: Internal Medicine | Admitting: Internal Medicine

## 2016-01-05 VITALS — BP 140/76 | HR 73 | Temp 98.0°F | Resp 20 | Wt 191.0 lb

## 2016-01-05 DIAGNOSIS — M75101 Unspecified rotator cuff tear or rupture of right shoulder, not specified as traumatic: Secondary | ICD-10-CM

## 2016-01-05 DIAGNOSIS — I10 Essential (primary) hypertension: Secondary | ICD-10-CM | POA: Diagnosis not present

## 2016-01-05 DIAGNOSIS — E785 Hyperlipidemia, unspecified: Secondary | ICD-10-CM | POA: Diagnosis not present

## 2016-01-05 DIAGNOSIS — M542 Cervicalgia: Secondary | ICD-10-CM | POA: Diagnosis not present

## 2016-01-05 DIAGNOSIS — M47812 Spondylosis without myelopathy or radiculopathy, cervical region: Secondary | ICD-10-CM | POA: Diagnosis not present

## 2016-01-05 MED ORDER — TRAMADOL HCL 50 MG PO TABS: 50.0000 mg | ORAL_TABLET | Freq: Three times a day (TID) | ORAL | 2 refills | Status: DC | PRN

## 2016-01-05 MED ORDER — ATORVASTATIN CALCIUM 20 MG PO TABS: 20.0000 mg | ORAL_TABLET | Freq: Every day | ORAL | 3 refills | Status: DC

## 2016-01-05 MED ORDER — TIZANIDINE HCL 4 MG PO TABS: 4.0000 mg | ORAL_TABLET | Freq: Four times a day (QID) | ORAL | 1 refills | Status: DC | PRN

## 2016-01-05 MED ORDER — PREDNISONE 10 MG PO TABS: ORAL_TABLET | ORAL | 0 refills | Status: DC

## 2016-01-05 NOTE — Assessment & Plan Note (Signed)
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

## 2016-01-05 NOTE — Progress Notes (Signed)
Pre visit review using our clinic review tool, if applicable. No additional management support is needed unless otherwise documented below in the visit note. 

## 2016-01-05 NOTE — Progress Notes (Signed)
Subjective:    Patient ID: Brandon [name], male    DOB: 31-Jul-1940, 75 y.o.   MRN: [id number]  HPI  Here with c/o bilat post neck pain and reduced ROM for over 1 wk, pain occurs with any motion, possibly a bit worse to turn head left and right, hard to see the blind spot with driving, tends to move the whole torso to move the head left and right horizontal. Pt denies bowel or bladder change, fever, wt loss,  worsening Upper or lower extrremity pain/numbness/weakness, gait change or falls.  No recent trauma.  Has had more discomfort usually first thing in the am to waking up, wondering about the pillow he uses.  No recent imaging, ortho eval or other tx.   Also c/o persistent right shoulder discomfort and RUE weakness,, though mild is still persistent, despite seeing Dr Tamala Julian in this office and [facility] orthopedic.  MRI shows mult abnormalities including rot cuff tear, pt is asking for second opinion, wants to know if any kind of surgury would end up improving his RUE weakness which though mild, he would rather not have.  Realizes he is getting older, wants to do this now.  Also crestor costs $45/mo, still quite expensive even at the generic price,  Is tolerating well, trying to follow lower chol diet, but wanting to see if can be changed.  Has not tried other statin Past Medical History:  Diagnosis Date  . 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 BENIGN PROSTATIC HYPERTROPHY 11/15/2006  . 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 COLONIC POLYPS, HX OF 11/15/2006  . ERECTILE DYSFUNCTION 11/15/2006  . GERD 11/15/2006  . HYPERLIPIDEMIA 11/15/2006  . HYPERTENSION 11/15/2006   borderline  . Lipoma 11/29/2010  . LOW BACK PAIN 11/14/2007   recurrent  . PARESTHESIA, HANDS 11/23/2008  . PERIPHERAL VASCULAR DISEASE 11/25/2009   right carotid 60-79% July 2011  . PSA, INCREASED 11/14/2007  . 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 COLONOSCOPY    . INGUINAL HERNIA REPAIR  2001  . MASS EXCISION  04/06/2011   Procedure: EXCISION MASS;  Surgeon: Judieth Keens, DO;  Location: [facility];  Service: General;  Laterality: Right;  excision right  upper back mass  . PROSTATE BIOPSY  Fall 2009   S/P-neg-Dr. Jeffie Pollock    reports that he quit smoking about 55 years ago. He has never used smokeless tobacco. He reports that he drinks alcohol. He reports that he does not use drugs. family history includes Cancer in his mother and other; Depression in his mother; Heart disease in his father; Stroke in his father. No Known Allergies Current Outpatient Prescriptions on File Prior to Visit  Medication Sig Dispense Refill  . Acetaminophen (TYLENOL ARTHRITIS PAIN PO) Take by mouth daily.    Marland Kitchen amLODipine (NORVASC) 5 MG tablet TAKE ONE TABLET BY MOUTH DAILY. 90 tablet 3  . amLODipine (NORVASC) 5 MG tablet TAKE ONE TABLET BY MOUTH ONCE DAILY. 90 tablet 0  . aspirin 81 MG tablet Take 81 mg by mouth daily.      . calcium & magnesium carbonates (MYLANTA) 311-232 MG per tablet Take 1 tablet by mouth daily.    . cholecalciferol (VITAMIN D) 1000 units tablet Take 1,000 Units by mouth daily.    . Cinnamon 500 MG capsule Take 500 mg by mouth 2 (two) times daily.      Marland Kitchen  Diclofenac Sodium (PENNSAID) 2 % SOLN Apply fingertip amount to affected area twice daily 112 g 3  . finasteride (PROSCAR) 5 MG tablet Take 5 mg by mouth daily.      . Multiple Vitamin (MULTIVITAMIN) tablet Take 1 tablet by mouth daily.      . naproxen (NAPROSYN) 250 MG tablet Take by mouth 1 day or 1 dose.    Marland Kitchen omeprazole (PRILOSEC) 20 MG capsule TAKE ONE CAPSULE BY MOUTH ONCE DAILY AS NEEDED 90 capsule 0  . vitamin C (ASCORBIC ACID) 500 MG tablet Take 500 mg by mouth daily.     No current facility-administered medications on file prior to visit.    Review of Systems  Constitutional: Negative for unusual diaphoresis  or night sweats HENT: Negative for ear swelling or discharge Eyes: Negative for worsening visual haziness  Respiratory: Negative for choking and stridor.   Gastrointestinal: Negative for distension or worsening eructation Genitourinary: Negative for retention or change in urine volume.  Musculoskeletal: Negative for other MSK pain or swelling Skin: Negative for color change and worsening wound Neurological: Negative for tremors and numbness other than noted  Psychiatric/Behavioral: Negative for decreased concentration or agitation other than above       Objective:   Physical Exam BP 140/76   Pulse 73   Temp 98 F (36.7 C) (Oral)   Resp 20   Wt 191 lb (86.6 kg)   SpO2 94%   BMI 29.04 kg/m  VS noted,  Constitutional: Pt appears in no apparent distress HENT: Head: NCAT.  Right Ear: External ear normal.  Left Ear: External ear normal.  Eyes: . Pupils are equal, round, and reactive to light. Conjunctivae and EOM are normal Neck: Normal range of motion. Neck supple.  Cardiovascular: Normal rate and regular rhythm.   Pulmonary/Chest: Effort normal and breath sounds without rales or wheezing.  Abd:  Soft, NT, ND, + BS Neurological: Pt is alert. Not confused , motor with 4+/5 RUE o/w intact Skin: Skin is warm. No rash, no LE edema Psychiatric: Pt behavior is normal. No agitation.  Right shoulder NT, no swelling but marked decreased ROM  MRI OF THE RIGHT SHOULDER WITHOUT CONTRAST 08-04-15 IMPRESSION: Complete supraspinatus, infraspinatus and subscapularis tendon tears with 3-4 cm of retraction. There is some fatty atrophy of the muscle bellies which is most notable in the supraspinatus. Edema in the muscles suggests acute or subacute injury.  Complete tear of the long head of biceps from the superior labrum.  Bulky acromioclavicular osteoarthritis.  Subacromial/subdeltoid bursitis.  Severely degenerated superior labrum.  High-riding humeral head demonstrates mild  posterior subluxation on the glenoid suggests a glenohumeral joint instability.    Assessment & Plan:

## 2016-01-05 NOTE — Assessment & Plan Note (Signed)
With severe abnormal MRI, persistent mild pain and weakness, pt asking for referral new ortho for second opinion

## 2016-01-05 NOTE — Patient Instructions (Addendum)
Please take all new medication as prescribed   - the pain medication, muscle relaxer, and prednisone  OK to stop the crestor due to cost  Please take all new medication as prescribed - lipitor 20 mg per day  Please continue all other medications as before, and refills have been done if requested.  Please have the pharmacy call with any other refills you may need.  Please continue your efforts at being more active, low cholesterol diet, and weight control.  You are otherwise up to date with prevention measures today.  You will be contacted regarding the referral for: orthopedic ([facility])  Please keep your appointments with your specialists as you may have planned  Please go to the XRAY Department in the Basement (go straight as you get off the elevator) for the x-ray testing  You will be contacted by phone if any changes need to be made immediately.  Otherwise, you will receive a letter about your results with an explanation, but please check with MyChart first.  Please remember to sign up for MyChart if you have not done so, as this will be important to you in the future with finding out test results, communicating by private email, and scheduling acute appointments online when needed.

## 2016-01-05 NOTE — Assessment & Plan Note (Signed)
stable overall by history and exam, recent data reviewed with pt, and pt to continue medical treatment as before,  to f/u any worsening symptoms or concerns BP Readings from Last 3 Encounters:  01/05/16 140/76  07/26/15 128/74  07/05/15 138/80

## 2016-01-05 NOTE — Assessment & Plan Note (Signed)
Ok for change expensive crestor to lipitor asd,  to f/u any worsening symptoms or concerns, cont low chol diet

## 2016-01-14 ENCOUNTER — Encounter: Payer: Self-pay | Admitting: Internal Medicine

## 2016-01-19 ENCOUNTER — Ambulatory Visit: Payer: Medicare Other | Admitting: Family Medicine

## 2016-01-19 DIAGNOSIS — M25511 Pain in right shoulder: Secondary | ICD-10-CM | POA: Diagnosis not present

## 2016-02-01 ENCOUNTER — Other Ambulatory Visit: Payer: Self-pay | Admitting: Internal Medicine

## 2016-05-05 DIAGNOSIS — N401 Enlarged prostate with lower urinary tract symptoms: Secondary | ICD-10-CM | POA: Diagnosis not present

## 2016-05-05 DIAGNOSIS — R972 Elevated prostate specific antigen [PSA]: Secondary | ICD-10-CM | POA: Diagnosis not present

## 2016-05-05 DIAGNOSIS — N138 Other obstructive and reflux uropathy: Secondary | ICD-10-CM | POA: Diagnosis not present

## 2016-05-07 ENCOUNTER — Other Ambulatory Visit: Payer: Self-pay | Admitting: Internal Medicine

## 2016-06-02 ENCOUNTER — Ambulatory Visit (INDEPENDENT_AMBULATORY_CARE_PROVIDER_SITE_OTHER): Payer: Medicare Other | Admitting: Orthopedic Surgery

## 2016-06-02 ENCOUNTER — Encounter (INDEPENDENT_AMBULATORY_CARE_PROVIDER_SITE_OTHER): Payer: Self-pay | Admitting: Orthopedic Surgery

## 2016-06-02 VITALS — Ht 68.0 in | Wt 191.0 lb

## 2016-06-02 DIAGNOSIS — M76822 Posterior tibial tendinitis, left leg: Secondary | ICD-10-CM | POA: Diagnosis not present

## 2016-06-02 NOTE — Progress Notes (Signed)
Office Visit Note   Patient: Brandon [name]           Date of Birth: 02-Sep-1940           MRN: [id number] Visit Date: 06/02/2016              Requested by: [name], MD [place], [place] [address] PCP: Cathlean Cower, MD  Chief Complaint  Patient presents with  . Right Foot - Pain  . Left Foot - Pain    HPI: Patient is here for an rx for orthotics for his shoes. He is not diabetic and states that he used to get these made at [facility] clinic but that they do not do this anymore. The pt states that over the course of the past 11 years he has had several pairs made and that they help with his lower back pain. Pamella Pert, RMA    Assessment & Plan: Visit Diagnoses:  1. Posterior tibial tendinitis, left leg     Plan: I called [facility] and they will accept new patients for custom orthotics. Patient will give them a call for follow-up. 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  Follow-Up Instructions: Return if symptoms worsen or fail to improve.   Ortho Exam On examination patient is alert oriented no adenopathy well-dressed normal affect normal respiratory effort he has a pronated valgus forefoot with chronic posterior tibial tendon insufficiency. He has no tenderness to palpation the sinus Tarsi no signs of impingement syndrome he has good dorsalis pedis pulse he has good subtalar and ankle motion. Patient does have some tenderness to palpation plantar aspect of the midfoot.  Imaging: No results found.  Orders:  No orders of the defined types were placed in this encounter.  No orders of the defined types were placed in this encounter.    Procedures: No procedures performed  Clinical Data: No additional findings.  Subjective: Review of Systems  Objective: Vital Signs: Ht 5\' 8"  (1.727 m)   Wt 191 lb (86.6 kg)   BMI 29.04 kg/m   Specialty Comments:  No  specialty comments available.  PMFS History: Patient Active Problem List   Diagnosis Date Noted  . Posterior neck pain 01/05/2016  . Bursitis of left shoulder 07/05/2015  . Dupuytren's contracture of left hand 05/04/2015  . Rotator cuff tear 04/06/2015  . Bilateral shoulder pain 03/24/2015  . Musculoskeletal pain 03/24/2015  . Left carpal tunnel syndrome 12/09/2012  . Trigger finger, acquired 12/09/2012  . 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 Lipoma of back 11/29/2010  . Preventative health care 08/12/2010  . PERIPHERAL VASCULAR DISEASE 11/25/2009  . 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 LOW BACK PAIN 11/14/2007  . PSA, INCREASED 11/14/2007  . Hyperlipidemia 11/15/2006  . ERECTILE DYSFUNCTION 11/15/2006  . Essential hypertension 11/15/2006  . GERD 11/15/2006  . BENIGN PROSTATIC HYPERTROPHY 11/15/2006  . Sciatica 11/15/2006  . COLONIC POLYPS, HX OF 11/15/2006   Past Medical History:  Diagnosis Date  . 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 BENIGN PROSTATIC HYPERTROPHY 11/15/2006  . 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 COLONIC POLYPS, HX OF 11/15/2006  . ERECTILE DYSFUNCTION 11/15/2006  . GERD 11/15/2006  . HYPERLIPIDEMIA 11/15/2006  . HYPERTENSION 11/15/2006   borderline  . Lipoma 11/29/2010  . LOW BACK PAIN 11/14/2007   recurrent  . PARESTHESIA, HANDS 11/23/2008  . PERIPHERAL VASCULAR DISEASE 11/25/2009  right carotid 60-79% July 2011  . PSA, INCREASED 11/14/2007  . Sciatica 11/15/2006   with known lumbardegenerative disk disease and degenerative joint disease    Family History  Problem Relation Age of Onset  . Cancer Mother     Lung Cancer  . Depression Mother   . Heart disease Father   . Stroke Father   . Cancer Other     Colon Cancer-Uncle    Past Surgical History:  Procedure Laterality Date  . COLONOSCOPY    . INGUINAL HERNIA REPAIR  2001  . MASS EXCISION  04/06/2011   Procedure: EXCISION MASS;  Surgeon: Judieth Keens,  DO;  Location: [facility];  Service: General;  Laterality: Right;  excision right  upper back mass  . PROSTATE BIOPSY  Fall 2009   S/P-neg-Dr. Jeffie Pollock   Social History   Occupational History  .      Retired   Social History Main Topics  . Smoking status: Former Smoker    Quit date: 02/09/1960  . Smokeless tobacco: Never Used  . Alcohol use Yes  . Drug use: No  . Sexual activity: Not on file

## 2016-06-15 ENCOUNTER — Ambulatory Visit (INDEPENDENT_AMBULATORY_CARE_PROVIDER_SITE_OTHER): Payer: Medicare Other

## 2016-06-15 ENCOUNTER — Encounter: Payer: Self-pay | Admitting: Podiatry

## 2016-06-15 ENCOUNTER — Ambulatory Visit (INDEPENDENT_AMBULATORY_CARE_PROVIDER_SITE_OTHER): Payer: Medicare Other | Admitting: Podiatry

## 2016-06-15 VITALS — BP 139/78 | HR 75 | Resp 16 | Ht 68.0 in | Wt 190.0 lb

## 2016-06-15 DIAGNOSIS — M7750 Other enthesopathy of unspecified foot: Secondary | ICD-10-CM

## 2016-06-15 DIAGNOSIS — M7751 Other enthesopathy of right foot: Secondary | ICD-10-CM | POA: Diagnosis not present

## 2016-06-15 DIAGNOSIS — M7752 Other enthesopathy of left foot: Secondary | ICD-10-CM | POA: Diagnosis not present

## 2016-06-15 DIAGNOSIS — M79671 Pain in right foot: Secondary | ICD-10-CM

## 2016-06-15 DIAGNOSIS — M79672 Pain in left foot: Secondary | ICD-10-CM

## 2016-06-15 DIAGNOSIS — M779 Enthesopathy, unspecified: Secondary | ICD-10-CM

## 2016-06-15 NOTE — Progress Notes (Signed)
   Subjective:    Patient ID: Brandon [name], male    DOB: 1940-06-29, 75 y.o.   MRN: [id number]  HPI Chief Complaint  Patient presents with  . Foot Orthotics    Wants to get new orthotics made today  . Foot Pain    Bilateral; plantar; pt stated, "have flat feet"      Review of Systems  All other systems reviewed and are negative.      Objective:   Physical Exam        Assessment & Plan:

## 2016-06-17 NOTE — Progress Notes (Signed)
Subjective:     Patient ID: Brandon [name], male   DOB: Nov 24, 1940, 76 y.o.   MRN: [id number]  HPI patient presents stating my feet are flat and it is making it hard for me to walk comfortably and I know I need new orthotics as my last pair was done a number of years ago   Review of Systems  All other systems reviewed and are negative.      Objective:   Physical Exam  Constitutional: He is oriented to person, place, and time.  Cardiovascular: Intact distal pulses.   Musculoskeletal: Normal range of motion.  Neurological: He is oriented to person, place, and time.  Skin: Skin is warm and dry.  Nursing note and vitals reviewed.  neurovascular status intact muscle strength was adequate range of motion within normal limits with patient found to have moderate depression of the arch bilateral mild crepitus in the ankle region bilateral and restriction of just the subtalar joint right over left. Patient's found have good digital perfusion is well oriented 3 with discomfort in the plantar heel region bilateral which is consistent with both feet     Assessment:     Tendinitis with inflammatory changes in the arch region bilateral with moderate arthritic conditions also present    Plan:     H&P conditions reviewed and recommended orthotics and at this time scanned for customized orthotics to lift the arch and reduce plantar pressure. Patient will be seen back for [place] to recheck when ready  X-ray report indicates moderate depression of the arch indications of arthritis with no further pathology noted

## 2016-06-26 ENCOUNTER — Other Ambulatory Visit (INDEPENDENT_AMBULATORY_CARE_PROVIDER_SITE_OTHER): Payer: Medicare Other

## 2016-06-26 DIAGNOSIS — Z0000 Encounter for general adult medical examination without abnormal findings: Secondary | ICD-10-CM | POA: Diagnosis not present

## 2016-06-26 LAB — CBC WITH DIFFERENTIAL/PLATELET
BASOS PCT: 0.9 % (ref 0.0–3.0)
Basophils Absolute: 0.1 10*3/uL (ref 0.0–0.1)
EOS ABS: 0.1 10*3/uL (ref 0.0–0.7)
Eosinophils Relative: 2.3 % (ref 0.0–5.0)
HEMATOCRIT: 46.9 % (ref 39.0–52.0)
Hemoglobin: 16.1 g/dL (ref 13.0–17.0)
LYMPHS PCT: 26.4 % (ref 12.0–46.0)
Lymphs Abs: 1.6 10*3/uL (ref 0.7–4.0)
MCHC: 34.3 g/dL (ref 30.0–36.0)
MCV: 88.9 fl (ref 78.0–100.0)
MONO ABS: 0.5 10*3/uL (ref 0.1–1.0)
Monocytes Relative: 7.9 % (ref 3.0–12.0)
NEUTROS ABS: 3.9 10*3/uL (ref 1.4–7.7)
Neutrophils Relative %: 62.5 % (ref 43.0–77.0)
Platelets: 270 10*3/uL (ref 150.0–400.0)
RBC: 5.27 Mil/uL (ref 4.22–5.81)
RDW: 13.8 % (ref 11.5–15.5)
WBC: 6.2 10*3/uL (ref 4.0–10.5)

## 2016-06-26 LAB — HEPATIC FUNCTION PANEL
ALT: 18 U/L (ref 0–53)
AST: 19 U/L (ref 0–37)
Albumin: 4.2 g/dL (ref 3.5–5.2)
Alkaline Phosphatase: 58 U/L (ref 39–117)
BILIRUBIN DIRECT: 0.2 mg/dL (ref 0.0–0.3)
BILIRUBIN TOTAL: 0.8 mg/dL (ref 0.2–1.2)
Total Protein: 6.5 g/dL (ref 6.0–8.3)

## 2016-06-26 LAB — URINALYSIS, ROUTINE W REFLEX MICROSCOPIC
Bilirubin Urine: NEGATIVE
HGB URINE DIPSTICK: NEGATIVE
Ketones, ur: NEGATIVE
Leukocytes, UA: NEGATIVE
Nitrite: NEGATIVE
RBC / HPF: NONE SEEN (ref 0–?)
SPECIFIC GRAVITY, URINE: 1.01 (ref 1.000–1.030)
Total Protein, Urine: NEGATIVE
URINE GLUCOSE: NEGATIVE
Urobilinogen, UA: 0.2 (ref 0.0–1.0)
pH: 7 (ref 5.0–8.0)

## 2016-06-26 LAB — BASIC METABOLIC PANEL
BUN: 19 mg/dL (ref 6–23)
CHLORIDE: 104 meq/L (ref 96–112)
CO2: 28 mEq/L (ref 19–32)
Calcium: 9.8 mg/dL (ref 8.4–10.5)
Creatinine, Ser: 1.19 mg/dL (ref 0.40–1.50)
GFR: 63.07 mL/min (ref >60.00)
Glucose, Bld: 99 mg/dL (ref 70–99)
Potassium: 4.6 mEq/L (ref 3.5–5.1)
Sodium: 140 mEq/L (ref 135–145)

## 2016-06-26 LAB — LIPID PANEL
CHOL/HDL RATIO: 2
Cholesterol: 145 mg/dL (ref 0–200)
HDL: 64.3 mg/dL (ref >39.00)
LDL Cholesterol: 68 mg/dL (ref 0–99)
NONHDL: 80.21
Triglycerides: 62 mg/dL (ref 0.0–149.0)
VLDL: 12.4 mg/dL (ref 0.0–40.0)

## 2016-06-26 LAB — PSA: PSA: 2.01 ng/mL (ref 0.10–4.00)

## 2016-06-26 LAB — TSH: TSH: 1.08 u[IU]/mL (ref 0.35–4.50)

## 2016-06-30 ENCOUNTER — Encounter: Payer: Self-pay | Admitting: Internal Medicine

## 2016-06-30 ENCOUNTER — Ambulatory Visit (INDEPENDENT_AMBULATORY_CARE_PROVIDER_SITE_OTHER): Payer: Medicare Other | Admitting: Internal Medicine

## 2016-06-30 VITALS — BP 124/84 | HR 78 | Temp 98.6°F | Ht 68.0 in | Wt 191.0 lb

## 2016-06-30 DIAGNOSIS — I6529 Occlusion and stenosis of unspecified carotid artery: Secondary | ICD-10-CM

## 2016-06-30 DIAGNOSIS — G5603 Carpal tunnel syndrome, bilateral upper limbs: Secondary | ICD-10-CM

## 2016-06-30 DIAGNOSIS — M75121 Complete rotator cuff tear or rupture of right shoulder, not specified as traumatic: Secondary | ICD-10-CM | POA: Diagnosis not present

## 2016-06-30 DIAGNOSIS — Z0000 Encounter for general adult medical examination without abnormal findings: Secondary | ICD-10-CM | POA: Diagnosis not present

## 2016-06-30 DIAGNOSIS — I10 Essential (primary) hypertension: Secondary | ICD-10-CM | POA: Diagnosis not present

## 2016-06-30 DIAGNOSIS — E785 Hyperlipidemia, unspecified: Secondary | ICD-10-CM | POA: Diagnosis not present

## 2016-06-30 MED ORDER — ROSUVASTATIN CALCIUM 20 MG PO TABS: 20.0000 mg | ORAL_TABLET | Freq: Every day | ORAL | 3 refills | Status: DC

## 2016-06-30 NOTE — Assessment & Plan Note (Signed)
stable overall by history and exam, recent data reviewed with pt, and pt to continue medical treatment as before,  to f/u any worsening symptoms or concerns BP Readings from Last 3 Encounters:  06/30/16 124/84  06/15/16 139/78  01/05/16 140/76

## 2016-06-30 NOTE — Progress Notes (Addendum)
Subjective:    Patient ID: Brandon [name], male    DOB: 1941-03-20, 76 y.o.   MRN: [id number]  HPI Here for wellness and f/u;  Overall doing ok;  Pt denies Chest pain, worsening SOB, DOE, wheezing, orthopnea, PND, worsening LE edema, palpitations, dizziness or syncope.  Pt denies neurological change such as new headache, facial or extremity weakness.  Pt denies polydipsia, polyuria, or low sugar symptoms. Pt states overall good compliance with treatment and medications, good tolerability, and has been trying to follow appropriate diet.  Pt denies worsening depressive symptoms, suicidal ideation or panic. No fever, night sweats, wt loss, loss of appetite, or other constitutional symptoms.  Pt states good ability with ADL's, has low fall risk, home safety reviewed and adequate, no other significant changes in hearing or vision, and only occasionally active with exercise, has been slowing down a bit, and doesn't really like to go to gym.  Wife disuaded him from much lawn care of the acre lot due to near complete right rotater cuff injury, left trigger finger and bilat CTS symptoms, as well as chronic left foot pain for which he is getting orthotics soon. BP Readings from Last 3 Encounters:  06/30/16 124/84  06/15/16 139/78  01/05/16 140/76  BP at home in last 4 days c/w 130/84 Wt Readings from Last 3 Encounters:  06/30/16 191 lb (86.6 kg)  06/15/16 190 lb (86.2 kg)  06/02/16 191 lb (86.6 kg)   Past Medical History:  Diagnosis Date  . 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 BENIGN PROSTATIC HYPERTROPHY 11/15/2006  . 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 COLONIC POLYPS, HX OF 11/15/2006  . ERECTILE DYSFUNCTION 11/15/2006  . GERD 11/15/2006  . HYPERLIPIDEMIA 11/15/2006  . HYPERTENSION 11/15/2006   borderline  . Lipoma 11/29/2010  . LOW BACK PAIN 11/14/2007   recurrent  . PARESTHESIA, HANDS 11/23/2008  . PERIPHERAL VASCULAR DISEASE 11/25/2009   right carotid 60-79% July 2011  . PSA,  INCREASED 11/14/2007  . 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 COLONOSCOPY    . INGUINAL HERNIA REPAIR  2001  . MASS EXCISION  04/06/2011   Procedure: EXCISION MASS;  Surgeon: Judieth Keens, DO;  Location: [facility];  Service: General;  Laterality: Right;  excision right  upper back mass  . PROSTATE BIOPSY  Fall 2009   S/P-neg-Dr. Jeffie Pollock    reports that he quit smoking about 56 years ago. He has never used smokeless tobacco. He reports that he drinks alcohol. He reports that he does not use drugs. family history includes Cancer in his mother and other; Depression in his mother; Heart disease in his father; Stroke in his father. No Known Allergies Current Outpatient Prescriptions on File Prior to Visit  Medication Sig Dispense Refill  . Acetaminophen (TYLENOL ARTHRITIS PAIN PO) Take by mouth daily.    Marland Kitchen amLODipine (NORVASC) 5 MG tablet Take 1 tablet (5 mg total) by mouth daily. Yearly physical due in February must see MD for refills 90 tablet 0  . aspirin 81 MG tablet Take 81 mg by mouth daily.      . calcium & magnesium carbonates (MYLANTA) 311-232 MG per tablet Take 1 tablet by mouth daily.    . cholecalciferol (VITAMIN D) 1000 units tablet Take 1,000 Units by mouth daily.    . Cinnamon 500 MG capsule Take 500 mg by mouth 2 (two) times  daily.      . finasteride (PROSCAR) 5 MG tablet Take 5 mg by mouth daily.      . Multiple Vitamin (MULTIVITAMIN) tablet Take 1 tablet by mouth daily.      . naproxen (NAPROSYN) 250 MG tablet Take by mouth 1 day or 1 dose.    Marland Kitchen omeprazole (PRILOSEC) 20 MG capsule TAKE ONE CAPSULE BY MOUTH ONCE DAILY AS NEEDED 90 capsule 0  . vitamin C (ASCORBIC ACID) 500 MG tablet Take 500 mg by mouth daily.     No current facility-administered medications on file prior to visit.    Review of Systems Constitutional: Negative for increased  diaphoresis, or other activity, appetite or siginficant weight change other than noted HENT: Negative for worsening hearing loss, ear pain, facial swelling, mouth sores and neck stiffness.   Eyes: Negative for other worsening pain, redness or visual disturbance.  Respiratory: Negative for choking or stridor Cardiovascular: Negative for other chest pain and palpitations.  Gastrointestinal: Negative for worsening diarrhea, blood in stool, or abdominal distention Genitourinary: Negative for hematuria, flank pain or change in urine volume.  Musculoskeletal: Negative for myalgias or other joint complaints.  Skin: Negative for other color change and wound or drainage.  Neurological: Negative for syncope and numbness. other than noted Hematological: Negative for adenopathy. or other swelling Psychiatric/Behavioral: Negative for hallucinations, SI, self-injury, decreased concentration or other worsening agitation.  All other system neg per pt    Objective:   Physical Exam BP 124/84   Pulse 78   Temp 98.6 F (37 C)   Ht 5\' 8"  (1.727 m)   Wt 191 lb (86.6 kg)   SpO2 98%   BMI 29.04 kg/m  VS noted,  Constitutional: Pt is oriented to person, place, and time. Appears well-developed and well-nourished, in no significant distress Head: Normocephalic and atraumatic  Eyes: Conjunctivae and EOM are normal. Pupils are equal, round, and reactive to light Right Ear: External ear normal.  Left Ear: External ear normal Nose: Nose normal.  Mouth/Throat: Oropharynx is clear and moist  Neck: Normal range of motion. Neck supple. No JVD present. No tracheal deviation present or significant neck LA or mass Cardiovascular: Normal rate, regular rhythm, normal heart sounds and intact distal pulses.   Pulmonary/Chest: Effort normal and breath sounds without rales or wheezing  Abdominal: Soft. Bowel sounds are normal. NT. No HSM  Musculoskeletal: Normal range of motion. Exhibits no edema, has + left 4th finger  trigger finger Lymphadenopathy: Has no cervical adenopathy.  Neurological: Pt is alert and oriented to person, place, and time. Pt has normal reflexes. No cranial nerve deficit. Motor grossly intact, sens intact to LT Skin: Skin is warm and dry. No rash noted or new ulcers Psychiatric:  Has normal mood and affect. Behavior is normal.  No other new exam findings  Lab Results  Component Value Date   WBC 6.2 06/26/2016   HGB 16.1 06/26/2016   HCT 46.9 06/26/2016   PLT 270.0 06/26/2016   GLUCOSE 99 06/26/2016   CHOL 145 06/26/2016   TRIG 62.0 06/26/2016   HDL 64.30 06/26/2016   LDLCALC 68 06/26/2016   ALT 18 06/26/2016   AST 19 06/26/2016   NA 140 06/26/2016   K 4.6 06/26/2016   CL 104 06/26/2016   CREATININE 1.19 06/26/2016   BUN 19 06/26/2016   CO2 28 06/26/2016   TSH 1.08 06/26/2016   PSA 2.01 06/26/2016   ECG I have personally interpreted today Sinus  Rhythm  -First degree A-V  block     Assessment & Plan:

## 2016-06-30 NOTE — Assessment & Plan Note (Signed)
Stable, cont current tx, for f/u June 2018

## 2016-06-30 NOTE — Assessment & Plan Note (Signed)
Left > right, urged bilat wrist splints, and pt has been putting off surgury x 2 yrs per hand surgury

## 2016-06-30 NOTE — Assessment & Plan Note (Signed)

## 2016-06-30 NOTE — Patient Instructions (Signed)
OK to change the lipitor back to the crestor as it is now generic  Please continue all other medications as before, and refills have been done if requested.  Please have the pharmacy call with any other refills you may need.  Please continue your efforts at being more active, low cholesterol diet, and weight control.  You are otherwise up to date with prevention measures today.  Please keep your appointments with your specialists as you may have planned  Please return in 1 year for your yearly visit, or sooner if needed, with Lab testing done 3-5 days before

## 2016-06-30 NOTE — Assessment & Plan Note (Signed)
Stable, but ok to change lipitor to generic crestor per pt preference as is now generic

## 2016-06-30 NOTE — Assessment & Plan Note (Signed)
Incidental right shoulder, pt has been deferring shoulder replacement per Dr Percell Miller for now

## 2016-07-13 ENCOUNTER — Ambulatory Visit: Payer: Medicare Other | Admitting: *Deleted

## 2016-07-13 DIAGNOSIS — M7750 Other enthesopathy of unspecified foot: Secondary | ICD-10-CM

## 2016-07-13 NOTE — Patient Instructions (Signed)

## 2016-07-13 NOTE — Progress Notes (Signed)
Patient ID: Brandon [name], male   DOB: Oct 03, 1940, 76 y.o.   MRN: [id number]  Patient presents for orthotic pick up.  Verbal and written break in and wear instructions given.  Patient will follow up in 4 weeks if symptoms worsen or fail to improve.

## 2016-07-14 ENCOUNTER — Other Ambulatory Visit: Payer: Medicare Other

## 2016-07-19 ENCOUNTER — Other Ambulatory Visit: Payer: Self-pay | Admitting: Internal Medicine

## 2016-08-17 DIAGNOSIS — H524 Presbyopia: Secondary | ICD-10-CM | POA: Diagnosis not present

## 2016-08-17 DIAGNOSIS — H35033 Hypertensive retinopathy, bilateral: Secondary | ICD-10-CM | POA: Diagnosis not present

## 2016-08-17 DIAGNOSIS — H401431 Capsular glaucoma with pseudoexfoliation of lens, bilateral, mild stage: Secondary | ICD-10-CM | POA: Diagnosis not present

## 2016-08-22 ENCOUNTER — Other Ambulatory Visit: Payer: Medicare Other

## 2016-09-13 ENCOUNTER — Other Ambulatory Visit: Payer: Medicare Other

## 2016-09-29 ENCOUNTER — Other Ambulatory Visit: Payer: Self-pay | Admitting: Internal Medicine

## 2016-10-06 DIAGNOSIS — Z08 Encounter for follow-up examination after completed treatment for malignant neoplasm: Secondary | ICD-10-CM | POA: Diagnosis not present

## 2016-10-06 DIAGNOSIS — L570 Actinic keratosis: Secondary | ICD-10-CM | POA: Diagnosis not present

## 2016-10-06 DIAGNOSIS — Z1283 Encounter for screening for malignant neoplasm of skin: Secondary | ICD-10-CM | POA: Diagnosis not present

## 2016-10-06 DIAGNOSIS — Z85828 Personal history of other malignant neoplasm of skin: Secondary | ICD-10-CM | POA: Diagnosis not present

## 2016-10-06 DIAGNOSIS — L255 Unspecified contact dermatitis due to plants, except food: Secondary | ICD-10-CM | POA: Diagnosis not present

## 2016-10-13 DIAGNOSIS — L814 Other melanin hyperpigmentation: Secondary | ICD-10-CM | POA: Diagnosis not present

## 2016-10-13 DIAGNOSIS — L818 Other specified disorders of pigmentation: Secondary | ICD-10-CM | POA: Diagnosis not present

## 2016-10-17 ENCOUNTER — Other Ambulatory Visit: Payer: Self-pay | Admitting: Internal Medicine

## 2016-10-17 DIAGNOSIS — I6523 Occlusion and stenosis of bilateral carotid arteries: Secondary | ICD-10-CM

## 2016-10-27 ENCOUNTER — Ambulatory Visit (HOSPITAL_COMMUNITY)
Admission: RE | Admit: 2016-10-27 | Discharge: 2016-10-27 | Disposition: A | Discharge status: Home / Self Care | Payer: Medicare Other | Source: Ambulatory Visit | Attending: Cardiology | Admitting: Cardiology

## 2016-10-27 DIAGNOSIS — I6523 Occlusion and stenosis of bilateral carotid arteries: Secondary | ICD-10-CM

## 2016-11-10 DIAGNOSIS — N400 Enlarged prostate without lower urinary tract symptoms: Secondary | ICD-10-CM | POA: Diagnosis not present

## 2016-11-10 DIAGNOSIS — R972 Elevated prostate specific antigen [PSA]: Secondary | ICD-10-CM | POA: Diagnosis not present

## 2017-04-11 DIAGNOSIS — G5603 Carpal tunnel syndrome, bilateral upper limbs: Secondary | ICD-10-CM | POA: Diagnosis not present

## 2017-04-11 DIAGNOSIS — M720 Palmar fascial fibromatosis [Dupuytren]: Secondary | ICD-10-CM | POA: Diagnosis not present

## 2017-05-09 DIAGNOSIS — M25552 Pain in left hip: Secondary | ICD-10-CM | POA: Diagnosis not present

## 2017-05-09 DIAGNOSIS — M545 Low back pain: Secondary | ICD-10-CM | POA: Diagnosis not present

## 2017-05-10 ENCOUNTER — Encounter: Payer: Self-pay | Admitting: Internal Medicine

## 2017-05-14 DIAGNOSIS — R972 Elevated prostate specific antigen [PSA]: Secondary | ICD-10-CM | POA: Diagnosis not present

## 2017-05-14 DIAGNOSIS — N401 Enlarged prostate with lower urinary tract symptoms: Secondary | ICD-10-CM | POA: Diagnosis not present

## 2017-05-14 DIAGNOSIS — N138 Other obstructive and reflux uropathy: Secondary | ICD-10-CM | POA: Diagnosis not present

## 2017-05-16 DIAGNOSIS — M5416 Radiculopathy, lumbar region: Secondary | ICD-10-CM | POA: Diagnosis not present

## 2017-05-21 DIAGNOSIS — M720 Palmar fascial fibromatosis [Dupuytren]: Secondary | ICD-10-CM | POA: Diagnosis not present

## 2017-05-23 DIAGNOSIS — M720 Palmar fascial fibromatosis [Dupuytren]: Secondary | ICD-10-CM | POA: Diagnosis not present

## 2017-05-29 DIAGNOSIS — M5416 Radiculopathy, lumbar region: Secondary | ICD-10-CM | POA: Diagnosis not present

## 2017-06-04 DIAGNOSIS — G5603 Carpal tunnel syndrome, bilateral upper limbs: Secondary | ICD-10-CM | POA: Diagnosis not present

## 2017-06-04 DIAGNOSIS — M720 Palmar fascial fibromatosis [Dupuytren]: Secondary | ICD-10-CM | POA: Diagnosis not present

## 2017-06-04 DIAGNOSIS — M79642 Pain in left hand: Secondary | ICD-10-CM | POA: Diagnosis not present

## 2017-06-21 DIAGNOSIS — R6889 Other general symptoms and signs: Secondary | ICD-10-CM | POA: Diagnosis not present

## 2017-07-01 ENCOUNTER — Other Ambulatory Visit: Payer: Self-pay | Admitting: Internal Medicine

## 2017-07-03 ENCOUNTER — Other Ambulatory Visit (INDEPENDENT_AMBULATORY_CARE_PROVIDER_SITE_OTHER): Payer: Medicare Other

## 2017-07-03 DIAGNOSIS — Z0000 Encounter for general adult medical examination without abnormal findings: Secondary | ICD-10-CM | POA: Diagnosis not present

## 2017-07-03 LAB — HEPATIC FUNCTION PANEL
ALBUMIN: 4.3 g/dL (ref 3.5–5.2)
ALT: 15 U/L (ref 0–53)
AST: 16 U/L (ref 0–37)
Alkaline Phosphatase: 55 U/L (ref 39–117)
Bilirubin, Direct: 0.1 mg/dL (ref 0.0–0.3)
TOTAL PROTEIN: 6.8 g/dL (ref 6.0–8.3)
Total Bilirubin: 0.7 mg/dL (ref 0.2–1.2)

## 2017-07-03 LAB — CBC WITH DIFFERENTIAL/PLATELET
BASOS PCT: 0.8 % (ref 0.0–3.0)
Basophils Absolute: 0.1 10*3/uL (ref 0.0–0.1)
Eosinophils Absolute: 0.2 10*3/uL (ref 0.0–0.7)
Eosinophils Relative: 2.6 % (ref 0.0–5.0)
HCT: 46.3 % (ref 39.0–52.0)
Hemoglobin: 16.1 g/dL (ref 13.0–17.0)
LYMPHS ABS: 1.7 10*3/uL (ref 0.7–4.0)
Lymphocytes Relative: 24.2 % (ref 12.0–46.0)
MCHC: 34.7 g/dL (ref 30.0–36.0)
MCV: 88.6 fl (ref 78.0–100.0)
MONOS PCT: 8.2 % (ref 3.0–12.0)
Monocytes Absolute: 0.6 10*3/uL (ref 0.1–1.0)
NEUTROS ABS: 4.4 10*3/uL (ref 1.4–7.7)
Neutrophils Relative %: 64.2 % (ref 43.0–77.0)
PLATELETS: 267 10*3/uL (ref 150.0–400.0)
RBC: 5.23 Mil/uL (ref 4.22–5.81)
RDW: 14.1 % (ref 11.5–15.5)
WBC: 6.9 10*3/uL (ref 4.0–10.5)

## 2017-07-03 LAB — BASIC METABOLIC PANEL
BUN: 25 mg/dL — ABNORMAL HIGH (ref 6–23)
CHLORIDE: 105 meq/L (ref 96–112)
CO2: 26 mEq/L (ref 19–32)
CREATININE: 1.12 mg/dL (ref 0.40–1.50)
Calcium: 9.9 mg/dL (ref 8.4–10.5)
GFR: 67.46 mL/min (ref >60.00)
Glucose, Bld: 87 mg/dL (ref 70–99)
POTASSIUM: 4.5 meq/L (ref 3.5–5.1)
SODIUM: 138 meq/L (ref 135–145)

## 2017-07-03 LAB — URINALYSIS, ROUTINE W REFLEX MICROSCOPIC
Bilirubin Urine: NEGATIVE
Hgb urine dipstick: NEGATIVE
Ketones, ur: NEGATIVE
Leukocytes, UA: NEGATIVE
Nitrite: NEGATIVE
PH: 7 (ref 5.0–8.0)
RBC / HPF: NONE SEEN (ref 0–?)
Specific Gravity, Urine: 1.015 (ref 1.000–1.030)
TOTAL PROTEIN, URINE-UPE24: NEGATIVE
URINE GLUCOSE: NEGATIVE
UROBILINOGEN UA: 0.2 (ref 0.0–1.0)
WBC, UA: NONE SEEN (ref 0–?)

## 2017-07-03 LAB — TSH: TSH: 1.25 u[IU]/mL (ref 0.35–4.50)

## 2017-07-03 LAB — LIPID PANEL
CHOLESTEROL: 132 mg/dL (ref 0–200)
HDL: 64.6 mg/dL (ref >39.00)
LDL Cholesterol: 55 mg/dL (ref 0–99)
NonHDL: 67.86
TRIGLYCERIDES: 64 mg/dL (ref 0.0–149.0)
Total CHOL/HDL Ratio: 2
VLDL: 12.8 mg/dL (ref 0.0–40.0)

## 2017-07-03 LAB — PSA: PSA: 1.55 ng/mL (ref 0.10–4.00)

## 2017-07-05 ENCOUNTER — Encounter: Payer: Self-pay | Admitting: Internal Medicine

## 2017-07-05 ENCOUNTER — Ambulatory Visit (INDEPENDENT_AMBULATORY_CARE_PROVIDER_SITE_OTHER): Payer: Medicare Other | Admitting: Internal Medicine

## 2017-07-05 VITALS — BP 124/80 | HR 83 | Temp 99.0°F | Ht 68.0 in | Wt 196.0 lb

## 2017-07-05 DIAGNOSIS — I10 Essential (primary) hypertension: Secondary | ICD-10-CM

## 2017-07-05 DIAGNOSIS — R6889 Other general symptoms and signs: Secondary | ICD-10-CM

## 2017-07-05 DIAGNOSIS — K219 Gastro-esophageal reflux disease without esophagitis: Secondary | ICD-10-CM

## 2017-07-05 LAB — POC INFLUENZA A&B (BINAX/QUICKVUE)
INFLUENZA A, POC: POSITIVE — AB
INFLUENZA B, POC: NEGATIVE

## 2017-07-05 MED ORDER — OSELTAMIVIR PHOSPHATE 75 MG PO CAPS: 75.0000 mg | ORAL_CAPSULE | Freq: Two times a day (BID) | ORAL | 0 refills | Status: DC

## 2017-07-05 MED ORDER — HYDROCODONE-HOMATROPINE 5-1.5 MG/5ML PO SYRP: 5.0000 mL | ORAL_SOLUTION | Freq: Four times a day (QID) | ORAL | 0 refills | Status: AC | PRN

## 2017-07-05 NOTE — Progress Notes (Signed)
Subjective:    Patient ID: Brandon [name], male    DOB: May 04, 1940, 77 y.o.   MRN: [id number]  HPI  Here with acute onset mild to mod 2-3 days ST, HA, general weakness and malaise, dhest congestion with prod cough clearish sputum, with diffuse myalgias, but Pt denies chest pain, increased sob or doe, wheezing, orthopnea, PND, increased LE swelling, palpitations, dizziness or syncope.  Pt denies new neurological symptoms such as new headache, or facial or extremity weakness or numbness   Pt denies polydipsia, polyuria  Denies worsening reflux, abd pain, dysphagia, n/v, bowel change or blood.  No other interval hx or new complaints Past Medical History:  Diagnosis Date  . 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 BENIGN PROSTATIC HYPERTROPHY 11/15/2006  . 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 COLONIC POLYPS, HX OF 11/15/2006  . ERECTILE DYSFUNCTION 11/15/2006  . GERD 11/15/2006  . HYPERLIPIDEMIA 11/15/2006  . HYPERTENSION 11/15/2006   borderline  . Lipoma 11/29/2010  . LOW BACK PAIN 11/14/2007   recurrent  . PARESTHESIA, HANDS 11/23/2008  . PERIPHERAL VASCULAR DISEASE 11/25/2009   right carotid 60-79% July 2011  . PSA, INCREASED 11/14/2007  . 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 COLONOSCOPY    . INGUINAL HERNIA REPAIR  2001  . MASS EXCISION  04/06/2011   Procedure: EXCISION MASS;  Surgeon: Judieth Keens, DO;  Location: [facility];  Service: General;  Laterality: Right;  excision right  upper back mass  . PROSTATE BIOPSY  Fall 2009   S/P-neg-Dr. Jeffie Pollock    reports that he quit smoking about 57 years ago. he has never used smokeless tobacco. He reports that he drinks alcohol. He reports that he does not use drugs. family history includes Cancer in his mother and other; Depression in his mother; Heart disease in his father; Stroke in his father. No  Known Allergies Current Outpatient Medications on File Prior to Visit  Medication Sig Dispense Refill  . Acetaminophen (TYLENOL ARTHRITIS PAIN PO) Take by mouth daily.    Marland Kitchen amLODipine (NORVASC) 5 MG tablet TAKE ONE TABLET BY MOUTH ONCE DAILY 90 tablet 3  . aspirin 81 MG tablet Take 81 mg by mouth daily.      . calcium & magnesium carbonates (MYLANTA) 311-232 MG per tablet Take 1 tablet by mouth daily.    . cholecalciferol (VITAMIN D) 1000 units tablet Take 1,000 Units by mouth daily.    . Cinnamon 500 MG capsule Take 500 mg by mouth 2 (two) times daily.      . finasteride (PROSCAR) 5 MG tablet Take 5 mg by mouth daily.      . Multiple Vitamin (MULTIVITAMIN) tablet Take 1 tablet by mouth daily.      . naproxen (NAPROSYN) 250 MG tablet Take by mouth 1 day or 1 dose.    Marland Kitchen omeprazole (PRILOSEC) 20 MG capsule TAKE ONE CAPSULE BY MOUTH ONCE DAILY AS NEEDED 90 capsule 2  . rosuvastatin (CRESTOR) 20 MG tablet TAKE 1 TABLET BY MOUTH 1 ONCE DAILY 90 tablet 0  . vitamin C (ASCORBIC ACID) 500 MG tablet Take 500 mg by mouth daily.     No current facility-administered medications on file prior to visit.    Review of Systems  Constitutional: Negative for other unusual diaphoresis or sweats HENT: Negative for ear discharge or swelling Eyes: Negative for other worsening visual disturbances Respiratory: Negative  for stridor or other swelling  Gastrointestinal: Negative for worsening distension or other blood Genitourinary: Negative for retention or other urinary change Musculoskeletal: Negative for other MSK pain or swelling Skin: Negative for color change or other new lesions Neurological: Negative for worsening tremors and other numbness  Psychiatric/Behavioral: Negative for worsening agitation or other fatigue All other system neg per pt    Objective:   Physical Exam BP 124/80   Pulse 83   Temp 99 F (37.2 C) (Oral)   Ht 5\' 8"  (1.727 m)   Wt 196 lb (88.9 kg)   SpO2 96%   BMI 29.80 kg/m  VS  noted, mild ill Constitutional: Pt appears in NAD HENT: Head: NCAT.  Right Ear: External ear normal.  Left Ear: External ear normal.  Eyes: . Pupils are equal, round, and reactive to light. Conjunctivae and EOM are normal Bilat tm's with mild erythema.  Max sinus areas non tender.  Pharynx with mild erythema, no exudate Nose: without d/c or deformity Neck: Neck supple. Gross normal ROM Cardiovascular: Normal rate and regular rhythm.   Pulmonary/Chest: Effort normal and breath sounds without rales or wheezing.  Abd:  Soft, NT, ND, + BS, no organomegaly Neurological: Pt is alert. At baseline orientation, motor grossly intact Skin: Skin is warm. No rashes, other new lesions, no LE edema Psychiatric: Pt behavior is normal without agitation  No other exam findings  Contains abnormal data POC Influenza A&B(BINAX/QUICKVUE)    Ref Range & Units 16:25  Influenza A, POC Negative Positive Abnormal    Influenza B, POC Negative Negative        Lab Results  Component Value Date   WBC 6.9 07/03/2017   HGB 16.1 07/03/2017   HCT 46.3 07/03/2017   PLT 267.0 07/03/2017   GLUCOSE 87 07/03/2017   CHOL 132 07/03/2017   TRIG 64.0 07/03/2017   HDL 64.60 07/03/2017   LDLCALC 55 07/03/2017   ALT 15 07/03/2017   AST 16 07/03/2017   NA 138 07/03/2017   K 4.5 07/03/2017   CL 105 07/03/2017   CREATININE 1.12 07/03/2017   BUN 25 (H) 07/03/2017   CO2 26 07/03/2017   TSH 1.25 07/03/2017   PSA 1.55 07/03/2017      Assessment & Plan:

## 2017-07-05 NOTE — Patient Instructions (Signed)
Please take all new medication as prescribed - the tamiflu, and cough medicine if needed  Please continue all other medications as before, and refills have been done if requested.  Please have the pharmacy call with any other refills you may need.  Please continue your efforts at being more active, low cholesterol diet, and weight control.  Please keep your appointments with your specialists as you may have planned  OK to reschedule your appt on Mon Mar 11 to 1 wk later

## 2017-07-06 ENCOUNTER — Encounter: Payer: Medicare Other | Admitting: Internal Medicine

## 2017-07-08 DIAGNOSIS — R6889 Other general symptoms and signs: Secondary | ICD-10-CM | POA: Insufficient documentation

## 2017-07-08 NOTE — Assessment & Plan Note (Signed)
stable overall by history and exam, recent data reviewed with pt, and pt to continue medical treatment as before,  to f/u any worsening symptoms or concerns BP Readings from Last 3 Encounters:  07/05/17 124/80  06/30/16 124/84  06/15/16 139/78

## 2017-07-08 NOTE — Assessment & Plan Note (Signed)
stable overall by history and exam, and pt to continue medical treatment as before,  to f/u any worsening symptoms or concerns 

## 2017-07-08 NOTE — Assessment & Plan Note (Addendum)
Rapid flu + for influenza A; d/w pt - for tamiflu course, cough med prn,  to f/u any worsening symptoms or concerns; assuming he improves as expected, pt still would like to reschedule mar 11 appt to later

## 2017-07-09 ENCOUNTER — Encounter: Payer: Medicare Other | Admitting: Internal Medicine

## 2017-07-23 ENCOUNTER — Encounter: Payer: Self-pay | Admitting: Internal Medicine

## 2017-07-23 ENCOUNTER — Ambulatory Visit (INDEPENDENT_AMBULATORY_CARE_PROVIDER_SITE_OTHER): Payer: Medicare Other | Admitting: Internal Medicine

## 2017-07-23 VITALS — BP 128/84 | HR 76 | Temp 97.8°F | Ht 68.0 in | Wt 186.0 lb

## 2017-07-23 DIAGNOSIS — G5602 Carpal tunnel syndrome, left upper limb: Secondary | ICD-10-CM

## 2017-07-23 DIAGNOSIS — I10 Essential (primary) hypertension: Secondary | ICD-10-CM

## 2017-07-23 DIAGNOSIS — Z0001 Encounter for general adult medical examination with abnormal findings: Secondary | ICD-10-CM | POA: Diagnosis not present

## 2017-07-23 DIAGNOSIS — K5900 Constipation, unspecified: Secondary | ICD-10-CM | POA: Insufficient documentation

## 2017-07-23 DIAGNOSIS — Z0000 Encounter for general adult medical examination without abnormal findings: Secondary | ICD-10-CM

## 2017-07-23 MED ORDER — ZOSTER VAC RECOMB ADJUVANTED 50 MCG/0.5ML IM SUSR: 0.5000 mL | Freq: Once | INTRAMUSCULAR | 1 refills | Status: AC

## 2017-07-23 NOTE — Assessment & Plan Note (Signed)

## 2017-07-23 NOTE — Assessment & Plan Note (Signed)
Vs cervical radiculitis, to f/u hand surgury

## 2017-07-23 NOTE — Assessment & Plan Note (Signed)
stable overall by history and exam, recent data reviewed with pt, and pt to continue medical treatment as before,  to f/u any worsening symptoms or concerns BP Readings from Last 3 Encounters:  07/23/17 128/84  07/05/17 124/80  06/30/16 124/84

## 2017-07-23 NOTE — Assessment & Plan Note (Signed)
[place] for [employer] and/or colace asd,  to f/u any worsening symptoms or concerns

## 2017-07-23 NOTE — Progress Notes (Signed)
Subjective:    Patient ID: Brandon [name], male    DOB: Jun 15, 1940, 77 y.o.   MRN: [id number]  HPI  Here for wellness and f/u;  Overall doing ok;  Pt denies Chest pain, worsening SOB, DOE, wheezing, orthopnea, PND, worsening LE edema, palpitations, dizziness or syncope.  Pt denies neurological change such as new headache, facial or extremity weakness.  Pt denies polydipsia, polyuria, or low sugar symptoms. Pt states overall good compliance with treatment and medications, good tolerability, and has been trying to follow appropriate diet.  Pt denies worsening depressive symptoms, suicidal ideation or panic. No fever, night sweats, wt loss, loss of appetite, or other constitutional symptoms.  Pt states good ability with ADL's, has low fall risk, home safety reviewed and adequate, no other significant changes in hearing or vision, and occasionally active with exercise with walking several times per wk.  Recent influenza symptoms resolved.  Is s/p 4th finger left hand dupytrens injection and manipulation per hand surgury, now improved.  Does have recurring mild constipation as well as ? Left CTS vs radicular symptoms .  No other exam findings Past Medical History:  Diagnosis Date  . 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 BENIGN PROSTATIC HYPERTROPHY 11/15/2006  . 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 COLONIC POLYPS, HX OF 11/15/2006  . ERECTILE DYSFUNCTION 11/15/2006  . GERD 11/15/2006  . HYPERLIPIDEMIA 11/15/2006  . HYPERTENSION 11/15/2006   borderline  . Lipoma 11/29/2010  . LOW BACK PAIN 11/14/2007   recurrent  . PARESTHESIA, HANDS 11/23/2008  . PERIPHERAL VASCULAR DISEASE 11/25/2009   right carotid 60-79% July 2011  . PSA, INCREASED 11/14/2007  . 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 COLONOSCOPY    . INGUINAL HERNIA REPAIR  2001  . MASS EXCISION  04/06/2011   Procedure: EXCISION MASS;  Surgeon: Judieth Keens, DO;  Location: [facility];  Service: General;  Laterality: Right;  excision right  upper back mass  . PROSTATE BIOPSY  Fall 2009   S/P-neg-Dr. Jeffie Pollock    reports that he quit smoking about 57 years ago. He has never used smokeless tobacco. He reports that he drinks alcohol. He reports that he does not use drugs. family history includes Cancer in his mother and other; Depression in his mother; Heart disease in his father; Stroke in his father. No Known Allergies Current Outpatient Medications on File Prior to Visit  Medication Sig Dispense Refill  . Acetaminophen (TYLENOL ARTHRITIS PAIN PO) Take by mouth daily.    Marland Kitchen amLODipine (NORVASC) 5 MG tablet TAKE ONE TABLET BY MOUTH ONCE DAILY 90 tablet 3  . aspirin 81 MG tablet Take 81 mg by mouth daily.      . calcium & magnesium carbonates (MYLANTA) 311-232 MG per tablet Take 1 tablet by mouth daily.    . cholecalciferol (VITAMIN D) 1000 units tablet Take 1,000 Units by mouth daily.    . Cinnamon 500 MG capsule Take 500 mg by mouth 2 (two) times daily.      . finasteride (PROSCAR) 5 MG tablet Take 5 mg by mouth daily.      . Multiple Vitamin (MULTIVITAMIN) tablet Take 1 tablet by mouth daily.      . naproxen (NAPROSYN) 250 MG tablet Take by mouth 1 day or 1 dose.    . Omega-3 Fatty Acids (FISH OIL) 1000 MG CAPS Take by mouth.    Marland Kitchen  omeprazole (PRILOSEC) 20 MG capsule TAKE ONE CAPSULE BY MOUTH ONCE DAILY AS NEEDED 90 capsule 2  . rosuvastatin (CRESTOR) 20 MG tablet TAKE 1 TABLET BY MOUTH 1 ONCE DAILY 90 tablet 0  . vitamin C (ASCORBIC ACID) 500 MG tablet Take 500 mg by mouth daily.     No current facility-administered medications on file prior to visit.    Review of Systems Constitutional: Negative for other unusual diaphoresis, sweats, appetite or weight changes HENT: Negative for other worsening hearing loss, ear pain, facial swelling, mouth sores or neck stiffness.   Eyes:  Negative for other worsening pain, redness or other visual disturbance.  Respiratory: Negative for other stridor or swelling Cardiovascular: Negative for other palpitations or other chest pain  Gastrointestinal: Negative for worsening diarrhea or loose stools, blood in stool, distention or other pain Genitourinary: Negative for hematuria, flank pain or other change in urine volume.  Musculoskeletal: Negative for myalgias or other joint swelling.  Skin: Negative for other color change, or other wound or worsening drainage.  Neurological: Negative for other syncope or numbness. Hematological: Negative for other adenopathy or swelling Psychiatric/Behavioral: Negative for hallucinations, other worsening agitation, SI, self-injury, or new decreased concentration All other system neg per pt    Objective:   Physical Exam BP 128/84   Pulse 76   Temp 97.8 F (36.6 C) (Oral)   Ht 5\' 8"  (1.727 m)   Wt 186 lb (84.4 kg)   SpO2 95%   BMI 28.28 kg/m  VS noted,  Constitutional: Pt is oriented to person, place, and time. Appears well-developed and well-nourished, in no significant distress and comfortable Head: Normocephalic and atraumatic  Eyes: Conjunctivae and EOM are normal. Pupils are equal, round, and reactive to light Right Ear: External ear normal without discharge Left Ear: External ear normal without discharge Nose: Nose without discharge or deformity Mouth/Throat: Oropharynx is without other ulcerations and moist  Neck: Normal range of motion. Neck supple. No JVD present. No tracheal deviation present or significant neck LA or mass Cardiovascular: Normal rate, regular rhythm, normal heart sounds and intact distal pulses.   Pulmonary/Chest: WOB normal and breath sounds without rales or wheezing  Abdominal: Soft. Bowel sounds are normal. NT. No HSM  Musculoskeletal: Normal range of motion. Exhibits no edema Lymphadenopathy: Has no other cervical adenopathy.  Neurological: Pt is alert and  oriented to person, place, and time. Pt has normal reflexes. No cranial nerve deficit. Motor grossly intact, Gait intact Skin: Skin is warm and dry. No rash noted or new ulcerations Psychiatric:  Has normal mood and affect. Behavior is normal without agitation No other exam findings    Assessment & Plan:

## 2017-07-23 NOTE — Patient Instructions (Addendum)
Please continue all other medications as before, and refills have been done if requested.  Please have the pharmacy call with any other refills you may need.  Please continue your efforts at being more active, low cholesterol diet, and weight control.  You are otherwise up to date with prevention measures today.  Please keep your appointments with your specialists as you may have planned  OK to try the Miralax and/or Colace as needed for constipation  Please seem Dr Amedeo Plenty to see if a Nerve Conduction Study or Cervical spine MRI is needed  Please return in 1 year for your yearly visit, or sooner if needed, with Lab testing done 3-5 days before

## 2017-08-07 ENCOUNTER — Other Ambulatory Visit: Payer: Self-pay | Admitting: Internal Medicine

## 2017-08-20 IMAGING — CR DG ORBITS FOR FOREIGN BODY
2 series · 2 of 2 positions shown · non-contrast
Comparison: None.

CLINICAL DATA: Pre MRI screen, history of metal exposure as a
machinist

EXAM:
ORBITS FOR FOREIGN BODY - 2 VIEW

[w orbit pa (1 of 2)]
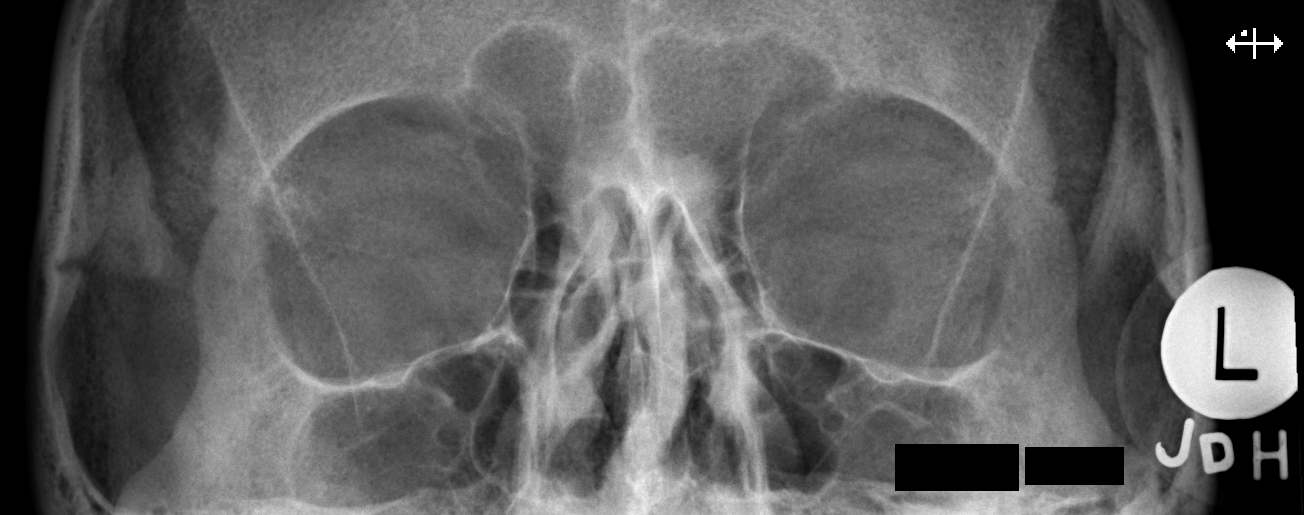

[w orbit pa (2 of 2)]
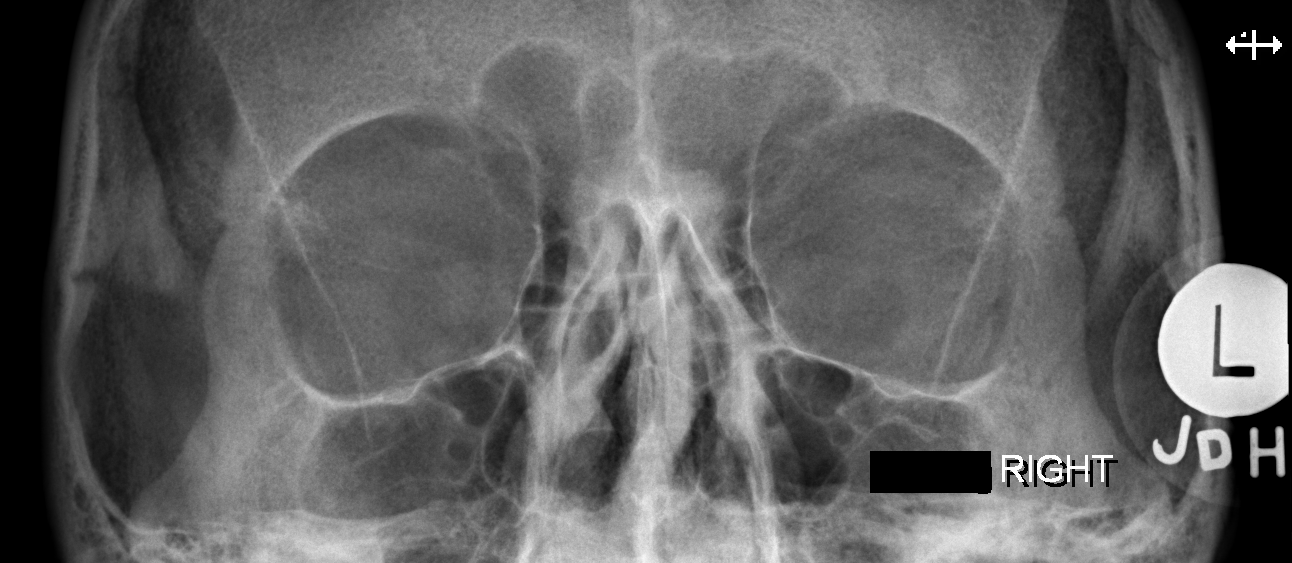

[2 of 2 positions shown; findings below may reference images not displayed]

FINDINGS: Views of the orbits were obtained with the patient looking to the
left and looking to the right. No orbital metallic foreign body is
seen. No bony abnormality is noted. The paranasal sinuses that are
visualized are pneumatized.
IMPRESSION: No evidence of metallic foreign body within the orbits.

## 2017-08-27 DIAGNOSIS — M79641 Pain in right hand: Secondary | ICD-10-CM | POA: Diagnosis not present

## 2017-08-27 DIAGNOSIS — M79642 Pain in left hand: Secondary | ICD-10-CM | POA: Diagnosis not present

## 2017-08-27 DIAGNOSIS — G5601 Carpal tunnel syndrome, right upper limb: Secondary | ICD-10-CM | POA: Diagnosis not present

## 2017-08-27 DIAGNOSIS — G5602 Carpal tunnel syndrome, left upper limb: Secondary | ICD-10-CM | POA: Diagnosis not present

## 2017-08-27 DIAGNOSIS — M720 Palmar fascial fibromatosis [Dupuytren]: Secondary | ICD-10-CM | POA: Diagnosis not present

## 2017-09-05 DIAGNOSIS — M5136 Other intervertebral disc degeneration, lumbar region: Secondary | ICD-10-CM | POA: Diagnosis not present

## 2017-09-05 DIAGNOSIS — M545 Low back pain: Secondary | ICD-10-CM | POA: Diagnosis not present

## 2017-09-05 DIAGNOSIS — M48061 Spinal stenosis, lumbar region without neurogenic claudication: Secondary | ICD-10-CM | POA: Diagnosis not present

## 2017-09-24 ENCOUNTER — Other Ambulatory Visit: Payer: Self-pay | Admitting: Internal Medicine

## 2017-10-04 ENCOUNTER — Ambulatory Visit (INDEPENDENT_AMBULATORY_CARE_PROVIDER_SITE_OTHER): Payer: Medicare Other | Admitting: Internal Medicine

## 2017-10-04 ENCOUNTER — Encounter: Payer: Self-pay | Admitting: Internal Medicine

## 2017-10-04 VITALS — BP 138/88 | HR 74 | Temp 97.9°F | Ht 68.0 in | Wt 189.0 lb

## 2017-10-04 DIAGNOSIS — K219 Gastro-esophageal reflux disease without esophagitis: Secondary | ICD-10-CM

## 2017-10-04 DIAGNOSIS — I10 Essential (primary) hypertension: Secondary | ICD-10-CM | POA: Diagnosis not present

## 2017-10-04 DIAGNOSIS — J309 Allergic rhinitis, unspecified: Secondary | ICD-10-CM

## 2017-10-04 MED ORDER — METHYLPREDNISOLONE ACETATE 80 MG/ML IJ SUSP
80.0000 mg | Freq: Once | INTRAMUSCULAR | Status: AC
  Administered 2017-10-04: 80 mg via INTRAMUSCULAR

## 2017-10-04 MED ORDER — HYDROCODONE-HOMATROPINE 5-1.5 MG/5ML PO SYRP: 5.0000 mL | ORAL_SOLUTION | Freq: Four times a day (QID) | ORAL | 0 refills | Status: AC | PRN

## 2017-10-04 MED ORDER — FEXOFENADINE HCL 180 MG PO TABS: 180.0000 mg | ORAL_TABLET | Freq: Every day | ORAL | 2 refills | Status: DC

## 2017-10-04 MED ORDER — TRIAMCINOLONE ACETONIDE 55 MCG/ACT NA AERO: 2.0000 | INHALATION_SPRAY | Freq: Every day | NASAL | 12 refills | Status: DC

## 2017-10-04 NOTE — Progress Notes (Signed)
Subjective:    Patient ID: Brandon [name], male    DOB: [birth year], 77 y.o.   MRN: [id number]  HPI  Here to f/u allergies with cough; Does have several wks ongoing nasal allergy symptoms with clearish congestion, itch and sneezing, and scant prod cough, without fever, pain, ST, swelling or wheezing.  Pt denies new neurological symptoms such as new headache, or facial or extremity weakness or numbness   Pt denies polydipsia, polyuria  Denies worsening reflux, abd pain, dysphagia, n/v, bowel change or blood. Past Medical History:  Diagnosis Date  . 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 BENIGN PROSTATIC HYPERTROPHY 11/15/2006  . 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 COLONIC POLYPS, HX OF 11/15/2006  . ERECTILE DYSFUNCTION 11/15/2006  . GERD 11/15/2006  . HYPERLIPIDEMIA 11/15/2006  . HYPERTENSION 11/15/2006   borderline  . Lipoma 11/29/2010  . LOW BACK PAIN 11/14/2007   recurrent  . PARESTHESIA, HANDS 11/23/2008  . PERIPHERAL VASCULAR DISEASE 11/25/2009   right carotid 60-79% July 2011  . PSA, INCREASED 11/14/2007  . 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 COLONOSCOPY    . INGUINAL HERNIA REPAIR  2001  . MASS EXCISION  04/06/2011   Procedure: EXCISION MASS;  Surgeon: Judieth Keens, DO;  Location: [facility];  Service: General;  Laterality: Right;  excision right  upper back mass  . PROSTATE BIOPSY  Fall 2009   S/P-neg-Dr. Jeffie Pollock    reports that he quit smoking about 57 years ago. He has never used smokeless tobacco. He reports that he drinks alcohol. He reports that he does not use drugs. family history includes Cancer in his mother and other; Depression in his mother; Heart disease in his father; Stroke in his father. No Known Allergies Current Outpatient Medications on File Prior to Visit  Medication Sig Dispense Refill  . Acetaminophen  (TYLENOL ARTHRITIS PAIN PO) Take by mouth daily.    Marland Kitchen amLODipine (NORVASC) 5 MG tablet TAKE 1 TABLET BY MOUTH ONCE DAILY 90 tablet 3  . aspirin 81 MG tablet Take 81 mg by mouth daily.      . calcium & magnesium carbonates (MYLANTA) 311-232 MG per tablet Take 1 tablet by mouth daily.    . cholecalciferol (VITAMIN D) 1000 units tablet Take 1,000 Units by mouth daily.    . Cinnamon 500 MG capsule Take 500 mg by mouth 2 (two) times daily.      . finasteride (PROSCAR) 5 MG tablet Take 5 mg by mouth daily.      . Multiple Vitamin (MULTIVITAMIN) tablet Take 1 tablet by mouth daily.      . naproxen (NAPROSYN) 250 MG tablet Take by mouth 1 day or 1 dose.    . Omega-3 Fatty Acids (FISH OIL) 1000 MG CAPS Take by mouth.    Marland Kitchen omeprazole (PRILOSEC) 20 MG capsule TAKE ONE CAPSULE BY MOUTH ONCE DAILY AS NEEDED 90 capsule 2  . rosuvastatin (CRESTOR) 20 MG tablet TAKE 1 TABLET BY MOUTH ONCE DAILY 90 tablet 3  . vitamin C (ASCORBIC ACID) 500 MG tablet Take 500 mg by mouth daily.     No current facility-administered medications on file prior to visit.    Review of Systems  Constitutional: Negative for other unusual diaphoresis or sweats HENT: Negative for ear discharge or swelling Eyes: Negative for other worsening visual disturbances Respiratory: Negative for stridor or other swelling  Gastrointestinal:  Negative for worsening distension or other blood Genitourinary: Negative for retention or other urinary change Musculoskeletal: Negative for other MSK pain or swelling Skin: Negative for color change or other new lesions Neurological: Negative for worsening tremors and other numbness  Psychiatric/Behavioral: Negative for worsening agitation or other fatigue All other system neg per pt    Objective:   Physical Exam BP 138/88   Pulse 74   Temp 97.9 F (36.6 C) (Oral)   Ht 5\' 8"  (1.727 m)   Wt 189 lb (85.7 kg)   SpO2 96%   BMI 28.74 kg/m  VS noted, not ill appearing Constitutional: Pt appears in  NAD HENT: Head: NCAT.  Right Ear: External ear normal.  Left Ear: External ear normal.  Eyes: . Pupils are equal, round, and reactive to light. Conjunctivae and EOM are normal Bilat tm's with mild erythema.  Max sinus areas non tender.  Pharynx with mild erythema, no exudate Nose: without d/c or deformity Neck: Neck supple. Gross normal ROM Cardiovascular: Normal rate and regular rhythm.   Pulmonary/Chest: Effort normal and breath sounds without rales or wheezing.  Neurological: Pt is alert. At baseline orientation, motor grossly intact Skin: Skin is warm. No rashes, other new lesions, no LE edema Psychiatric: Pt behavior is normal without agitation  No other exam findings     Assessment & Plan:

## 2017-10-04 NOTE — Patient Instructions (Signed)
You had the steroid shot today  Please take all new medication as prescribed - the allegra, and Nasacort at least for the season worsening  Please take all new medication as prescribed - the cough medicine only if needed  Please continue all other medications as before, and refills have been done if requested.  Please have the pharmacy call with any other refills you may need.  Please keep your appointments with your specialists as you may have planned

## 2017-10-07 ENCOUNTER — Encounter: Payer: Self-pay | Admitting: Internal Medicine

## 2017-10-07 DIAGNOSIS — J309 Allergic rhinitis, unspecified: Secondary | ICD-10-CM | POA: Insufficient documentation

## 2017-10-07 NOTE — Assessment & Plan Note (Signed)
Mild to mod, for depomedrol IM 80, allegra prn, nasaocort and cough med prn for symptoms relief,  to f/u any worsening symptoms or concerns

## 2017-10-07 NOTE — Assessment & Plan Note (Signed)
unlikey to be related to cough, cont same tx,  to f/u any worsening symptoms or concerns

## 2017-10-07 NOTE — Assessment & Plan Note (Signed)
stable overall by history and exam, recent data reviewed with pt, and pt to continue medical treatment as before,  to f/u any worsening symptoms or concerns  

## 2017-10-11 DIAGNOSIS — H401431 Capsular glaucoma with pseudoexfoliation of lens, bilateral, mild stage: Secondary | ICD-10-CM | POA: Diagnosis not present

## 2017-10-12 DIAGNOSIS — M545 Low back pain: Secondary | ICD-10-CM | POA: Diagnosis not present

## 2017-10-18 DIAGNOSIS — M5416 Radiculopathy, lumbar region: Secondary | ICD-10-CM | POA: Diagnosis not present

## 2017-10-18 DIAGNOSIS — M48061 Spinal stenosis, lumbar region without neurogenic claudication: Secondary | ICD-10-CM | POA: Diagnosis not present

## 2017-10-18 DIAGNOSIS — M5136 Other intervertebral disc degeneration, lumbar region: Secondary | ICD-10-CM | POA: Diagnosis not present

## 2017-12-06 ENCOUNTER — Ambulatory Visit: Payer: Self-pay | Admitting: *Deleted

## 2017-12-06 NOTE — Telephone Encounter (Signed)
No triage done.  Returned call to Brandon [name]. He has been constipated and requested advice. He had a BM between his call and my return call. Stated he travels a lot and battles constipation when he does. Advice care given including: Increase water intake Increase fruits/vegetables Daily OTC stool softner and metamucil while traveling would be beneficial Try to stick with foods on his regular diet as much as possible.

## 2018-01-16 DIAGNOSIS — R6889 Other general symptoms and signs: Secondary | ICD-10-CM | POA: Diagnosis not present

## 2018-07-05 DIAGNOSIS — N138 Other obstructive and reflux uropathy: Secondary | ICD-10-CM | POA: Diagnosis not present

## 2018-07-31 ENCOUNTER — Encounter: Payer: Medicare Other | Admitting: Internal Medicine

## 2018-07-31 ENCOUNTER — Ambulatory Visit: Payer: Medicare Other

## 2018-08-25 ENCOUNTER — Other Ambulatory Visit: Payer: Self-pay | Admitting: Internal Medicine

## 2018-10-06 ENCOUNTER — Other Ambulatory Visit: Payer: Self-pay | Admitting: Internal Medicine

## 2018-11-08 ENCOUNTER — Other Ambulatory Visit (INDEPENDENT_AMBULATORY_CARE_PROVIDER_SITE_OTHER): Payer: Medicare Other

## 2018-11-08 ENCOUNTER — Telehealth: Payer: Self-pay

## 2018-11-08 DIAGNOSIS — Z0000 Encounter for general adult medical examination without abnormal findings: Secondary | ICD-10-CM

## 2018-11-08 LAB — URINALYSIS, ROUTINE W REFLEX MICROSCOPIC
Bilirubin Urine: NEGATIVE
Hgb urine dipstick: NEGATIVE
Ketones, ur: NEGATIVE
Leukocytes,Ua: NEGATIVE
Nitrite: NEGATIVE
RBC / HPF: NONE SEEN (ref 0–?)
Specific Gravity, Urine: 1.02 (ref 1.000–1.030)
Total Protein, Urine: NEGATIVE
Urine Glucose: NEGATIVE
Urobilinogen, UA: 0.2 (ref 0.0–1.0)
pH: 7.5 (ref 5.0–8.0)

## 2018-11-08 LAB — LIPID PANEL
Cholesterol: 136 mg/dL (ref 0–200)
HDL: 69.7 mg/dL (ref >39.00)
LDL Cholesterol: 53 mg/dL (ref 0–99)
NonHDL: 66.69
Total CHOL/HDL Ratio: 2
Triglycerides: 69 mg/dL (ref 0.0–149.0)
VLDL: 13.8 mg/dL (ref 0.0–40.0)

## 2018-11-08 LAB — PSA: PSA: 1.57 ng/mL (ref 0.10–4.00)

## 2018-11-08 LAB — BASIC METABOLIC PANEL
BUN: 21 mg/dL (ref 6–23)
CO2: 25 mEq/L (ref 19–32)
Calcium: 9.3 mg/dL (ref 8.4–10.5)
Chloride: 105 mEq/L (ref 96–112)
Creatinine, Ser: 1.12 mg/dL (ref 0.40–1.50)
GFR: 63.25 mL/min (ref >60.00)
Glucose, Bld: 101 mg/dL — ABNORMAL HIGH (ref 70–99)
Potassium: 4.3 mEq/L (ref 3.5–5.1)
Sodium: 140 mEq/L (ref 135–145)

## 2018-11-08 LAB — HEPATIC FUNCTION PANEL
ALT: 16 U/L (ref 0–53)
AST: 19 U/L (ref 0–37)
Albumin: 4.4 g/dL (ref 3.5–5.2)
Alkaline Phosphatase: 55 U/L (ref 39–117)
Bilirubin, Direct: 0.2 mg/dL (ref 0.0–0.3)
Total Bilirubin: 0.8 mg/dL (ref 0.2–1.2)
Total Protein: 6.7 g/dL (ref 6.0–8.3)

## 2018-11-08 LAB — CBC WITH DIFFERENTIAL/PLATELET
Basophils Absolute: 0.1 10*3/uL (ref 0.0–0.1)
Basophils Relative: 0.8 % (ref 0.0–3.0)
Eosinophils Absolute: 0.2 10*3/uL (ref 0.0–0.7)
Eosinophils Relative: 2.5 % (ref 0.0–5.0)
HCT: 46.6 % (ref 39.0–52.0)
Hemoglobin: 15.9 g/dL (ref 13.0–17.0)
Lymphocytes Relative: 23.8 % (ref 12.0–46.0)
Lymphs Abs: 1.6 10*3/uL (ref 0.7–4.0)
MCHC: 34.1 g/dL (ref 30.0–36.0)
MCV: 91.3 fl (ref 78.0–100.0)
Monocytes Absolute: 0.5 10*3/uL (ref 0.1–1.0)
Monocytes Relative: 7.8 % (ref 3.0–12.0)
Neutro Abs: 4.3 10*3/uL (ref 1.4–7.7)
Neutrophils Relative %: 65.1 % (ref 43.0–77.0)
Platelets: 276 10*3/uL (ref 150.0–400.0)
RBC: 5.1 Mil/uL (ref 4.22–5.81)
RDW: 14 % (ref 11.5–15.5)
WBC: 6.7 10*3/uL (ref 4.0–10.5)

## 2018-11-08 LAB — TSH: TSH: 0.98 u[IU]/mL (ref 0.35–4.50)

## 2018-11-08 NOTE — Telephone Encounter (Signed)
Orders for upcoming OV needed to be entered

## 2018-11-13 ENCOUNTER — Ambulatory Visit (INDEPENDENT_AMBULATORY_CARE_PROVIDER_SITE_OTHER): Payer: Medicare Other | Admitting: *Deleted

## 2018-11-13 ENCOUNTER — Other Ambulatory Visit: Payer: Self-pay

## 2018-11-13 ENCOUNTER — Encounter: Payer: Self-pay | Admitting: Internal Medicine

## 2018-11-13 ENCOUNTER — Ambulatory Visit (INDEPENDENT_AMBULATORY_CARE_PROVIDER_SITE_OTHER): Payer: Medicare Other | Admitting: Internal Medicine

## 2018-11-13 VITALS — BP 134/88 | HR 77 | Temp 98.5°F | Ht 68.0 in | Wt 193.0 lb

## 2018-11-13 VITALS — BP 134/88 | HR 77 | Ht 68.0 in | Wt 193.0 lb

## 2018-11-13 DIAGNOSIS — I10 Essential (primary) hypertension: Secondary | ICD-10-CM | POA: Diagnosis not present

## 2018-11-13 DIAGNOSIS — N401 Enlarged prostate with lower urinary tract symptoms: Secondary | ICD-10-CM | POA: Diagnosis not present

## 2018-11-13 DIAGNOSIS — Z0001 Encounter for general adult medical examination with abnormal findings: Secondary | ICD-10-CM | POA: Diagnosis not present

## 2018-11-13 DIAGNOSIS — Z0000 Encounter for general adult medical examination without abnormal findings: Secondary | ICD-10-CM

## 2018-11-13 DIAGNOSIS — G4700 Insomnia, unspecified: Secondary | ICD-10-CM | POA: Diagnosis not present

## 2018-11-13 DIAGNOSIS — R351 Nocturia: Secondary | ICD-10-CM

## 2018-11-13 DIAGNOSIS — Z23 Encounter for immunization: Secondary | ICD-10-CM | POA: Diagnosis not present

## 2018-11-13 DIAGNOSIS — G5603 Carpal tunnel syndrome, bilateral upper limbs: Secondary | ICD-10-CM | POA: Diagnosis not present

## 2018-11-13 NOTE — Progress Notes (Addendum)
Subjective:    Patient ID: Brandon [name], male    DOB: Mar 19, 1941, 78 y.o.   MRN: [id number]  HPI  Here for wellness and f/u;  Overall doing ok;  Pt denies Chest pain, worsening SOB, DOE, wheezing, orthopnea, PND, worsening LE edema, palpitations, dizziness or syncope.  Pt denies neurological change such as new headache, facial or extremity weakness.  Pt denies polydipsia, polyuria, or low sugar symptoms. Pt states overall good compliance with treatment and medications, good tolerability, and has been trying to follow appropriate diet.  Pt denies worsening depressive symptoms, suicidal ideation or panic. No fever, night sweats, wt loss, loss of appetite, or other constitutional symptoms.  Pt states good ability with ADL's, has low fall risk, home safety reviewed and adequate, no other significant changes in hearing or vision, and only occasionally active with exercise.   Also c/o bilat hand numbness and tingling without pain or weakness, mild for several months, worse in the AM , gets better within an hour but overall seems to be worsening, asking for referral Also with persistent insomnia for several months, and after discussion decides he does not want rx med.   Also with mild nocturia 1-2 times per night for over a year, with still fairly good stream and no retention type symptoms. Denies urinary symptoms such as dysuria, frequency, urgency, flank pain, hematuria or n/v, fever, chills.   Past Medical History:  Diagnosis Date  . 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 BENIGN PROSTATIC HYPERTROPHY 11/15/2006  . 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 COLONIC POLYPS, HX OF 11/15/2006  . ERECTILE DYSFUNCTION 11/15/2006  . GERD 11/15/2006  . HYPERLIPIDEMIA 11/15/2006  . HYPERTENSION 11/15/2006   borderline  . Lipoma 11/29/2010  . LOW BACK PAIN 11/14/2007   recurrent  . PARESTHESIA, HANDS 11/23/2008  . PERIPHERAL VASCULAR DISEASE 11/25/2009   right carotid 60-79% July 2011  . PSA,  INCREASED 11/14/2007  . 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 COLONOSCOPY    . INGUINAL HERNIA REPAIR  2001  . MASS EXCISION  04/06/2011   Procedure: EXCISION MASS;  Surgeon: Judieth Keens, DO;  Location: [facility];  Service: General;  Laterality: Right;  excision right  upper back mass  . PROSTATE BIOPSY  Fall 2009   S/P-neg-Dr. Jeffie Pollock    reports that he quit smoking about 58 years ago. He has never used smokeless tobacco. He reports current alcohol use. He reports that he does not use drugs. family history includes Cancer in his mother and another family member; Depression in his mother; Heart disease in his father; Stroke in his father. No Known Allergies Current Outpatient Medications on File Prior to Visit  Medication Sig Dispense Refill  . Acetaminophen (TYLENOL ARTHRITIS PAIN PO) Take by mouth daily.    Marland Kitchen amLODipine (NORVASC) 5 MG tablet Take 1 tablet by mouth once daily 90 tablet 0  . calcium & magnesium carbonates (MYLANTA) 757-972 MG per tablet Take 1 tablet by mouth daily.    . cholecalciferol (VITAMIN D) 1000 units tablet Take 5,000 Units by mouth daily.     . finasteride (PROSCAR) 5 MG tablet Take 5 mg by mouth daily.      . Multiple Vitamin (MULTIVITAMIN) tablet Take 1 tablet by mouth daily.      . Omega-3 Fatty Acids (FISH OIL) 1000 MG CAPS Take by mouth.    Marland Kitchen omeprazole (  PRILOSEC) 20 MG capsule TAKE 1 CAPSULE BY MOUTH ONCE DAILY AS NEEDED 90 capsule 0  . rosuvastatin (CRESTOR) 20 MG tablet Take 1 tablet by mouth once daily 90 tablet 0  . vitamin C (ASCORBIC ACID) 500 MG tablet Take 500 mg by mouth daily.     No current facility-administered medications on file prior to visit.    Review of Systems Constitutional: Negative for other unusual diaphoresis, sweats, appetite or weight changes HENT: Negative for other worsening hearing loss, ear  pain, facial swelling, mouth sores or neck stiffness.   Eyes: Negative for other worsening pain, redness or other visual disturbance.  Respiratory: Negative for other stridor or swelling Cardiovascular: Negative for other palpitations or other chest pain  Gastrointestinal: Negative for worsening diarrhea or loose stools, blood in stool, distention or other pain Genitourinary: Negative for hematuria, flank pain or other change in urine volume.  Musculoskeletal: Negative for myalgias or other joint swelling.  Skin: Negative for other color change, or other wound or worsening drainage.  Neurological: Negative for other syncope or numbness. Hematological: Negative for other adenopathy or swelling Psychiatric/Behavioral: Negative for hallucinations, other worsening agitation, SI, self-injury, or new decreased concentration All other system neg per pt    Objective:   Physical Exam BP 134/88   Pulse 77   Temp 98.5 F (36.9 C) (Oral)   Ht 5\' 8"  (1.727 m)   Wt 193 lb (87.5 kg)   SpO2 95%   BMI 29.35 kg/m  VS noted,  Constitutional: Pt is oriented to person, place, and time. Appears well-developed and well-nourished, in no significant distress and comfortable Head: Normocephalic and atraumatic  Eyes: Conjunctivae and EOM are normal. Pupils are equal, round, and reactive to light Right Ear: External ear normal without discharge Left Ear: External ear normal without discharge Nose: Nose without discharge or deformity Mouth/Throat: Oropharynx is without other ulcerations and moist  Neck: Normal range of motion. Neck supple. No JVD present. No tracheal deviation present or significant neck LA or mass Cardiovascular: Normal rate, regular rhythm, normal heart sounds and intact distal pulses.   Pulmonary/Chest: WOB normal and breath sounds without rales or wheezing  Abdominal: Soft. Bowel sounds are normal. NT. No HSM  Musculoskeletal: Normal range of motion. Exhibits no edema Lymphadenopathy:  Has no other cervical adenopathy.  Neurological: Pt is alert and oriented to person, place, and time. Pt has normal reflexes. No cranial nerve deficit. Motor grossly intact, Gait intact Skin: Skin is warm and dry. No rash noted or new ulcerations Psychiatric:  Has nervous mood and affect. Behavior is normal without agitation No other exam findings Lab Results  Component Value Date   WBC 6.7 11/08/2018   HGB 15.9 11/08/2018   HCT 46.6 11/08/2018   PLT 276.0 11/08/2018   GLUCOSE 101 (H) 11/08/2018   CHOL 136 11/08/2018   TRIG 69.0 11/08/2018   HDL 69.70 11/08/2018   LDLCALC 53 11/08/2018   ALT 16 11/08/2018   AST 19 11/08/2018   NA 140 11/08/2018   K 4.3 11/08/2018   CL 105 11/08/2018   CREATININE 1.12 11/08/2018   BUN 21 11/08/2018   CO2 25 11/08/2018   TSH 0.98 11/08/2018   PSA 1.57 11/08/2018       Assessment & Plan:

## 2018-11-13 NOTE — Patient Instructions (Addendum)
Continue doing brain stimulating activities (puzzles, reading, adult coloring books, staying active) to keep memory sharp.   Continue to eat heart healthy diet (full of fruits, vegetables, whole grains, lean protein, water--limit salt, fat, and sugar intake) and increase physical activity as tolerated.   Brandon [name] , Thank you for taking time to come for your Medicare Wellness Visit. I appreciate your ongoing commitment to your health goals. Please review the following plan we discussed and let me know if I can assist you in the future.   These are the goals we discussed: Goals    . Patient Stated     Increase my physical activity by going to the gym when it opens. Eat healthy and enjoy life.    . Weight (lb) < 200 lb (90.7 kg)     Loose weight       This is a list of the screening recommended for you and due dates:  Health Maintenance  Topic Date Due  . Tetanus Vaccine  11/24/2018  . Flu Shot  11/30/2018  . Pneumonia vaccines  Completed    Preventive Care 20 Years and Older, Male Preventive care refers to lifestyle choices and visits with your health care provider that can promote health and wellness. This includes:  A yearly physical exam. This is also called an annual well check.  Regular dental and eye exams.  Immunizations.  Screening for certain conditions.  Healthy lifestyle choices, such as diet and exercise. What can I expect for my preventive care visit? Physical exam Your health care provider will check:  Height and weight. These may be used to calculate body mass index (BMI), which is a measurement that tells if you are at a healthy weight.  Heart rate and blood pressure.  Your skin for abnormal spots. Counseling Your health care provider may ask you questions about:  Alcohol, tobacco, and drug use.  Emotional well-being.  Home and relationship well-being.  Sexual activity.  Eating habits.  History of falls.  Memory and ability to understand  (cognition).  Work and work Statistician. What immunizations do I need?  Influenza (flu) vaccine  This is recommended every year. Tetanus, diphtheria, and pertussis (Tdap) vaccine  You may need a Td booster every 10 years. Varicella (chickenpox) vaccine  You may need this vaccine if you have not already been vaccinated. Zoster (shingles) vaccine  You may need this after age 86. Pneumococcal conjugate (PCV13) vaccine  One dose is recommended after age 40. Pneumococcal polysaccharide (PPSV23) vaccine  One dose is recommended after age 44. Measles, mumps, and rubella (MMR) vaccine  You may need at least one dose of MMR if you were born in 1957 or later. You may also need a second dose. Meningococcal conjugate (MenACWY) vaccine  You may need this if you have certain conditions. Hepatitis A vaccine  You may need this if you have certain conditions or if you travel or work in places where you may be exposed to hepatitis A. Hepatitis B vaccine  You may need this if you have certain conditions or if you travel or work in places where you may be exposed to hepatitis B. Haemophilus influenzae type b (Hib) vaccine  You may need this if you have certain conditions. You may receive vaccines as individual doses or as more than one vaccine together in one shot (combination vaccines). Talk with your health care provider about the risks and benefits of combination vaccines. What tests do I need? Blood tests  Lipid and cholesterol  levels. These may be checked every 5 years, or more frequently depending on your overall health.  Hepatitis C test.  Hepatitis B test. Screening  Lung cancer screening. You may have this screening every year starting at age 85 if you have a 30-pack-year history of smoking and currently smoke or have quit within the past 15 years.  Colorectal cancer screening. All adults should have this screening starting at age 44 and continuing until age 64. Your health  care provider may recommend screening at age 71 if you are at increased risk. You will have tests every 1-10 years, depending on your results and the type of screening test.  Prostate cancer screening. Recommendations will vary depending on your family history and other risks.  Diabetes screening. This is done by checking your blood sugar (glucose) after you have not eaten for a while (fasting). You may have this done every 1-3 years.  Abdominal aortic aneurysm (AAA) screening. You may need this if you are a current or former smoker.  Sexually transmitted disease (STD) testing. Follow these instructions at home: Eating and drinking  Eat a diet that includes fresh fruits and vegetables, whole grains, lean protein, and low-fat dairy products. Limit your intake of foods with high amounts of sugar, saturated fats, and salt.  Take vitamin and mineral supplements as recommended by your health care provider.  Do not drink alcohol if your health care provider tells you not to drink.  If you drink alcohol: ? Limit how much you have to 0-2 drinks a day. ? Be aware of how much alcohol is in your drink. In the [place] one drink equals one 12 oz bottle of beer (355 mL), one 5 oz glass of Joesiah Lonon (148 mL), or one 1 oz glass of hard liquor (44 mL). Lifestyle  Take daily care of your teeth and gums.  Stay active. Exercise for at least 30 minutes on 5 or more days each week.  Do not use any products that contain nicotine or tobacco, such as cigarettes, e-cigarettes, and chewing tobacco. If you need help quitting, ask your health care provider.  If you are sexually active, practice safe sex. Use a condom or other form of protection to prevent STIs (sexually transmitted infections).  Talk with your health care provider about taking a low-dose aspirin or statin. What's next?  Visit your health care provider once a year for a well check visit.  Ask your health care provider how often you should have your  eyes and teeth checked.  Stay up to date on all vaccines. This information is not intended to replace advice given to you by your health care provider. Make sure you discuss any questions you have with your health care provider. Document Released: 05/14/2015 Document Revised: 04/11/2018 Document Reviewed: 04/11/2018 Elsevier Patient Education  2020 [employer].

## 2018-11-13 NOTE — Patient Instructions (Addendum)
You had the Tdap tetanus shot today  Please wear the wrist splints at night to both wrists  You will be contacted regarding the referral for: Neurosurgury  Please continue all other medications as before, and refills have been done if requested.  Please have the pharmacy call with any other refills you may need.  Please continue your efforts at being more active, low cholesterol diet, and weight control.  You are otherwise up to date with prevention measures today.  Please keep your appointments with your specialists as you may have planned

## 2018-11-13 NOTE — Progress Notes (Addendum)
Subjective:   Brandon [name] is a 78 y.o. male who presents for an Initial Medicare Annual Wellness Visit.  Review of Systems   Cardiac Risk Factors include: advanced age (>27men, >52 women);dyslipidemia;male gender;hypertension Sleep patterns: has interrupted sleep, gets up 1 times nightly to void and sleeps 6-7 hours nightly.    Home Safety/Smoke Alarms: Feels safe in home. Smoke alarms in place.  Living environment; residence and Firearm Safety: 1-story house/ trailer. Lives with wife, no needs for DME, good support system Seat Belt Safety/Bike Helmet: Wears seat belt.   PSA-  Lab Results  Component Value Date   PSA 1.57 11/08/2018   PSA 1.55 07/03/2017   PSA 2.01 06/26/2016      Objective:    Today's Vitals   11/13/18 0837 11/13/18 1001  BP: 134/88   Pulse: 77   SpO2: 95%   Weight: 193 lb (87.5 kg)   Height: 5\' 8"  (1.727 m)   PainSc:  2    Body mass index is 29.35 kg/m.  Advanced Directives 11/13/2018 06/30/2016 04/04/2011  Does Patient Have a Medical Advance Directive? Yes No;Yes Patient does not have advance directive  Type of Scientist, forensic Power of [place];Living will - -  Copy of [place] in Chart? No - copy requested - -    Current Medications (verified) Outpatient Encounter Medications as of 11/13/2018  Medication Sig  . Acetaminophen (TYLENOL ARTHRITIS PAIN PO) Take by mouth daily.  Marland Kitchen amLODipine (NORVASC) 5 MG tablet Take 1 tablet by mouth once daily  . calcium & magnesium carbonates (MYLANTA) 311-232 MG per tablet Take 1 tablet by mouth daily.  . cholecalciferol (VITAMIN D) 1000 units tablet Take 5,000 Units by mouth daily.   . finasteride (PROSCAR) 5 MG tablet Take 5 mg by mouth daily.    . Multiple Vitamin (MULTIVITAMIN) tablet Take 1 tablet by mouth daily.    . Omega-3 Fatty Acids (FISH OIL) 1000 MG CAPS Take by mouth.  Marland Kitchen omeprazole (PRILOSEC) 20 MG capsule TAKE 1 CAPSULE BY MOUTH ONCE DAILY AS NEEDED  .  rosuvastatin (CRESTOR) 20 MG tablet Take 1 tablet by mouth once daily  . vitamin C (ASCORBIC ACID) 500 MG tablet Take 500 mg by mouth daily.  . [DISCONTINUED] aspirin 81 MG tablet Take 81 mg by mouth daily.    . [DISCONTINUED] Cinnamon 500 MG capsule Take 500 mg by mouth 2 (two) times daily.    . [DISCONTINUED] fexofenadine (ALLEGRA) 180 MG tablet Take 1 tablet (180 mg total) by mouth daily.  . [DISCONTINUED] naproxen (NAPROSYN) 250 MG tablet Take by mouth 1 day or 1 dose.  . [DISCONTINUED] triamcinolone (NASACORT) 55 MCG/ACT AERO nasal inhaler Place 2 sprays into the nose daily.   No facility-administered encounter medications on file as of 11/13/2018.     Allergies (verified) Patient has no known allergies.   History: Past Medical History:  Diagnosis Date  . 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 BENIGN PROSTATIC HYPERTROPHY 11/15/2006  . 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 COLONIC POLYPS, HX OF 11/15/2006  . ERECTILE DYSFUNCTION 11/15/2006  . GERD 11/15/2006  . HYPERLIPIDEMIA 11/15/2006  . HYPERTENSION 11/15/2006   borderline  . Lipoma 11/29/2010  . LOW BACK PAIN 11/14/2007   recurrent  . PARESTHESIA, HANDS 11/23/2008  . PERIPHERAL VASCULAR DISEASE 11/25/2009   right carotid 60-79% July 2011  . PSA, INCREASED 11/14/2007  . Sciatica 11/15/2006   with known lumbardegenerative disk disease and degenerative joint disease   Past Surgical  History:  Procedure Laterality Date  . COLONOSCOPY    . INGUINAL HERNIA REPAIR  2001  . MASS EXCISION  04/06/2011   Procedure: EXCISION MASS;  Surgeon: Judieth Keens, DO;  Location: [facility];  Service: General;  Laterality: Right;  excision right  upper back mass  . PROSTATE BIOPSY  Fall 2009   S/P-neg-Dr. Jeffie Pollock   Family History  Problem Relation Age of Onset  . Cancer Mother        Lung Cancer  . Depression Mother   . Heart disease Father   . Stroke Father   . Cancer Other        Colon Cancer-Uncle    Social History   Socioeconomic History  . Marital status: Married    Spouse name: Not on file  . Number of children: 3  . Years of education: Not on file  . Highest education level: Not on file  Occupational History  . Occupation: Retired    Comment: Retired  Scientific laboratory technician  . Financial resource strain: Not hard at all  . Food insecurity    Worry: Never true    Inability: Never true  . Transportation needs    Medical: No    Non-medical: No  Tobacco Use  . Smoking status: Former Smoker    Quit date: 02/09/1960    Years since quitting: 58.8  . Smokeless tobacco: Never Used  Substance and Sexual Activity  . Alcohol use: Yes  . Drug use: No  . Sexual activity: Not Currently  Lifestyle  . Physical activity    Days per week: 5 days    Minutes per session: 50 min  . Stress: Not at all  Relationships  . Social connections    Talks on phone: More than three times a week    Gets together: More than three times a week    Attends religious service: More than 4 times per year    Active member of club or organization: Yes    Attends meetings of clubs or organizations: More than 4 times per year    Relationship status: Married  Other Topics Concern  . Not on file  Social History Narrative   Retired aerospace machinist and [place] given: Not Answered  Activities of Daily Living In your present state of health, do you have any difficulty performing the following activities: 11/13/2018  Hearing? N  Vision? N  Difficulty concentrating or making decisions? N  Walking or climbing stairs? N  Dressing or bathing? N  Doing errands, shopping? N  Preparing Food and eating ? N  Using the Toilet? N  In the past six months, have you accidently leaked urine? N  Do you have problems with loss of bowel control? N  Managing your Medications? N  Managing your Finances? N  Housekeeping or managing your Housekeeping? N  Some recent data might be hidden      Immunizations and Health Maintenance Immunization History  Administered Date(s) Administered  . Influenza-Unspecified 03/02/2015, 03/02/2017, 01/14/2018  . Pneumococcal Conjugate-13 12/16/2013  . Pneumococcal Polysaccharide-23 05/01/2004, 11/29/2010  . Td 11/23/2008  . Tdap 11/13/2018  . Zoster 04/20/2009   There are no preventive care reminders to display for this patient.  Patient Care Team: [name], MD as PCP - General Myrlene Broker, MD as Attending Physician (Urology) Roseanne Kaufman, MD as Consulting Physician (Orthopedic Surgery) Susa Day, MD as Consulting Physician (Orthopedic Surgery)  Indicate any recent Medical Services  you may have received from other than [facility] providers in the past year (date may be approximate).    Assessment:   This is a routine wellness examination for [facility]. Physical assessment deferred to PCP.  Hearing/Vision screen  Hearing Screening   125Hz  250Hz  500Hz  1000Hz  2000Hz  3000Hz  4000Hz  6000Hz  8000Hz   Right ear:           Left ear:           Comments: Able to hear conversational tones w/o difficulty. No issues reported.     Vision Screening Comments: appointment yearly   Dietary issues and exercise activities discussed: Current Exercise Habits: Home exercise routine, Type of exercise: walking(golf), Time (Minutes): 50, Frequency (Times/Week): 5, Weekly Exercise (Minutes/Week): 250, Intensity: Mild, Exercise limited by: orthopedic condition(s) Diet (meal preparation, eat out, water intake, caffeinated beverages, dairy products, fruits and vegetables): in general, a "healthy" diet  , well balanced. eats a variety of fruits and vegetables daily, limits salt, fat/cholesterol, sugar,carbohydrates,caffeine, drinks 6-8 glasses of water daily.   Goals    . Patient Stated     Increase my physical activity by going to the gym when it opens. Eat healthy and enjoy life.    . Weight (lb) < 200 lb (90.7 kg)     Loose weight       Depression Screen PHQ 2/9 Scores 11/13/2018 11/13/2018 07/23/2017 06/30/2016  PHQ - 2 Score 0 0 0 0  PHQ- 9 Score - 0 0 -    Fall Risk Fall Risk  11/13/2018 07/23/2017 06/30/2016 06/25/2015 12/22/2014  Falls in the past year? 0 No No No No   Cognitive Function:       Ad8 score reviewed for issues:  Issues making decisions: no  Less interest in hobbies / activities: no  Repeats questions, stories (family complaining): no  Trouble using ordinary gadgets (microwave, computer, phone):no  Forgets the month or year: no  Mismanaging finances: no  Remembering appts: no  Daily problems with thinking and/or memory: no Ad8 score is= 0 Screening Tests Health Maintenance  Topic Date Due  . TETANUS/TDAP  11/24/2018  . INFLUENZA VACCINE  11/30/2018  . PNA vac Low Risk Adult  Completed      Plan:    Reviewed health maintenance screenings with patient today and relevant education, vaccines, and/or referrals were provided.   Continue doing brain stimulating activities (puzzles, reading, adult coloring books, staying active) to keep memory sharp.   Continue to eat heart healthy diet (full of fruits, vegetables, whole grains, lean protein, water--limit salt, fat, and sugar intake) and increase physical activity as tolerated.  I have personally reviewed and noted the following in the patient's chart:   . Medical and social history . Use of alcohol, tobacco or illicit drugs  . Current medications and supplements . Functional ability and status . Nutritional status . Physical activity . Advanced directives . List of other physicians . Vitals . Screenings to include cognitive, depression, and falls . Referrals and appointments  In addition, I have reviewed and discussed with patient certain preventive protocols, quality metrics, and best practice recommendations. A written personalized care plan for preventive services as well as general preventive health recommendations were provided to  patient.     Michiel Cowboy, RN   11/13/2018      Medical screening examination/treatment/procedure(s) were performed by non-physician practitioner and as supervising physician I was immediately available for consultation/collaboration. I agree with above. Cathlean Cower, MD

## 2018-11-16 ENCOUNTER — Other Ambulatory Visit: Payer: Self-pay | Admitting: Internal Medicine

## 2018-11-17 ENCOUNTER — Encounter: Payer: Self-pay | Admitting: Internal Medicine

## 2018-11-17 NOTE — Assessment & Plan Note (Signed)
stable overall by history and exam, recent data reviewed with pt, and pt to continue medical treatment as before,  to f/u any worsening symptoms or concerns  

## 2018-11-17 NOTE — Assessment & Plan Note (Signed)

## 2018-11-17 NOTE — Assessment & Plan Note (Addendum)
Mild to mod, for bilat wrist splint qhs until improved, and refer NS  In addition to the time spent performing CPE, I spent an additional 25 minutes face to face,in which greater than 50% of this time was spent in counseling and coordination of care for patient's acute illness as documented, including the differential dx, treatment, further evaluation and other management of bilat CTS, nocturia,/BPH, HTN, insomnia

## 2018-11-17 NOTE — Assessment & Plan Note (Signed)
With mild nocturia, declines flomax for now or urology referral

## 2018-11-17 NOTE — Assessment & Plan Note (Signed)
Mild for otc melatonin prn, to f/u any worsening symptoms or concerns

## 2018-12-30 DIAGNOSIS — G5602 Carpal tunnel syndrome, left upper limb: Secondary | ICD-10-CM | POA: Diagnosis not present

## 2018-12-30 DIAGNOSIS — G5601 Carpal tunnel syndrome, right upper limb: Secondary | ICD-10-CM | POA: Diagnosis not present

## 2018-12-30 DIAGNOSIS — I10 Essential (primary) hypertension: Secondary | ICD-10-CM | POA: Diagnosis not present

## 2019-01-13 ENCOUNTER — Telehealth: Payer: Self-pay | Admitting: Internal Medicine

## 2019-01-13 MED ORDER — ROSUVASTATIN CALCIUM 20 MG PO TABS: 20.0000 mg | ORAL_TABLET | Freq: Every day | ORAL | 1 refills | Status: DC

## 2019-01-13 NOTE — Telephone Encounter (Signed)
Medication: rosuvastatin (CRESTOR) 20 MG tablet   Patient is requesting a refill of this medication.    Pharmacy:  [facility], [place] [phone] (Phone) [phone] (Fax)

## 2019-01-20 DIAGNOSIS — G5602 Carpal tunnel syndrome, left upper limb: Secondary | ICD-10-CM | POA: Diagnosis not present

## 2019-01-20 DIAGNOSIS — G5601 Carpal tunnel syndrome, right upper limb: Secondary | ICD-10-CM | POA: Diagnosis not present

## 2019-01-23 DIAGNOSIS — G5602 Carpal tunnel syndrome, left upper limb: Secondary | ICD-10-CM | POA: Diagnosis not present

## 2019-02-03 DIAGNOSIS — H2513 Age-related nuclear cataract, bilateral: Secondary | ICD-10-CM | POA: Diagnosis not present

## 2019-02-03 DIAGNOSIS — H524 Presbyopia: Secondary | ICD-10-CM | POA: Diagnosis not present

## 2019-02-03 DIAGNOSIS — H5203 Hypermetropia, bilateral: Secondary | ICD-10-CM | POA: Diagnosis not present

## 2019-02-21 DIAGNOSIS — G5602 Carpal tunnel syndrome, left upper limb: Secondary | ICD-10-CM | POA: Diagnosis not present

## 2019-02-27 DIAGNOSIS — L570 Actinic keratosis: Secondary | ICD-10-CM | POA: Diagnosis not present

## 2019-02-27 DIAGNOSIS — Z1283 Encounter for screening for malignant neoplasm of skin: Secondary | ICD-10-CM | POA: Diagnosis not present

## 2019-02-27 DIAGNOSIS — D225 Melanocytic nevi of trunk: Secondary | ICD-10-CM | POA: Diagnosis not present

## 2019-02-27 DIAGNOSIS — X32XXXD Exposure to sunlight, subsequent encounter: Secondary | ICD-10-CM | POA: Diagnosis not present

## 2019-04-02 DIAGNOSIS — M79642 Pain in left hand: Secondary | ICD-10-CM | POA: Diagnosis not present

## 2019-04-02 DIAGNOSIS — G5600 Carpal tunnel syndrome, unspecified upper limb: Secondary | ICD-10-CM | POA: Diagnosis not present

## 2019-04-04 DIAGNOSIS — M79642 Pain in left hand: Secondary | ICD-10-CM | POA: Diagnosis not present

## 2019-04-08 DIAGNOSIS — M79642 Pain in left hand: Secondary | ICD-10-CM | POA: Diagnosis not present

## 2019-04-11 DIAGNOSIS — M79642 Pain in left hand: Secondary | ICD-10-CM | POA: Diagnosis not present

## 2019-04-16 DIAGNOSIS — M79642 Pain in left hand: Secondary | ICD-10-CM | POA: Diagnosis not present

## 2019-04-17 DIAGNOSIS — M25532 Pain in left wrist: Secondary | ICD-10-CM | POA: Diagnosis not present

## 2019-04-17 DIAGNOSIS — G5603 Carpal tunnel syndrome, bilateral upper limbs: Secondary | ICD-10-CM | POA: Diagnosis not present

## 2019-04-18 DIAGNOSIS — M79642 Pain in left hand: Secondary | ICD-10-CM | POA: Diagnosis not present

## 2019-04-29 ENCOUNTER — Encounter: Payer: Self-pay | Admitting: Internal Medicine

## 2019-04-29 ENCOUNTER — Ambulatory Visit (INDEPENDENT_AMBULATORY_CARE_PROVIDER_SITE_OTHER): Payer: Medicare Other | Admitting: Internal Medicine

## 2019-04-29 ENCOUNTER — Other Ambulatory Visit: Payer: Self-pay

## 2019-04-29 VITALS — BP 160/90 | HR 98 | Temp 98.9°F | Resp 12 | Ht 68.0 in | Wt 193.8 lb

## 2019-04-29 DIAGNOSIS — I10 Essential (primary) hypertension: Secondary | ICD-10-CM | POA: Diagnosis not present

## 2019-04-29 DIAGNOSIS — G4700 Insomnia, unspecified: Secondary | ICD-10-CM

## 2019-04-29 DIAGNOSIS — G5602 Carpal tunnel syndrome, left upper limb: Secondary | ICD-10-CM | POA: Diagnosis not present

## 2019-04-29 MED ORDER — TRAMADOL HCL 50 MG PO TABS: 50.0000 mg | ORAL_TABLET | Freq: Four times a day (QID) | ORAL | 0 refills | Status: DC | PRN

## 2019-04-29 MED ORDER — LOSARTAN POTASSIUM 100 MG PO TABS: 100.0000 mg | ORAL_TABLET | Freq: Every day | ORAL | 3 refills | Status: DC

## 2019-04-29 NOTE — Assessment & Plan Note (Signed)
S/p failed left CTS surgury per NS, now for hand surgury f/u intervention deferred today due to HTN uncontrolled, for tramadol prn,  to f/u any worsening symptoms or concerns

## 2019-04-29 NOTE — Assessment & Plan Note (Signed)
Uncontrolled, to add losartan 100 qd, f/u bp at home and next visit 1 wk

## 2019-04-29 NOTE — Assessment & Plan Note (Signed)
With recent stress, declines further tx for now, as believes tx of cts pain will be adequate

## 2019-04-29 NOTE — Progress Notes (Signed)
Subjective:    Patient ID: Brandon [name], male    DOB: 16-Jun-1940, 78 y.o.   MRN: [id number]  HPI  Here to f/u after sent home from [place] today due to HTN, was originally seeing Dr Amedeo Plenty, then later to [facility] wth oct 23 left CTS surgury, unfortunately pain was worse post surgury, never really got better, was referred to [facility] for second opinion in late Nov, also had repeat [facility] EMG post op on Dec 2 but still seemed to show CTS, so went back to Dr Amedeo Plenty and offered rather extensive surgury for today much more than a CTS release.  Cant sleep well at night, pain persists despite gabapentin, but stopped as he had leg swelling he thought might be related.  O/w good med compliance especially with the amlodipine 5 mg.  Has not been rescheduled for surgury until f/u BP improved here. Has BP cuff at home, latst 147/91.  Pt denies new neurological symptoms such as new headache, or facial or extremity weakness or numbness   Pt denies polydipsia, polyuria,  BP Readings from Last 3 Encounters:  04/29/19 (!) 160/90  11/13/18 134/88  11/13/18 134/88   Wt Readings from Last 3 Encounters:  04/29/19 193 lb 12.8 oz (87.9 kg)  11/13/18 193 lb (87.5 kg)  11/13/18 193 lb (87.5 kg)   Past Medical History:  Diagnosis Date  . 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 BENIGN PROSTATIC HYPERTROPHY 11/15/2006  . 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 COLONIC POLYPS, HX OF 11/15/2006  . ERECTILE DYSFUNCTION 11/15/2006  . GERD 11/15/2006  . HYPERLIPIDEMIA 11/15/2006  . HYPERTENSION 11/15/2006   borderline  . Lipoma 11/29/2010  . LOW BACK PAIN 11/14/2007   recurrent  . PARESTHESIA, HANDS 11/23/2008  . PERIPHERAL VASCULAR DISEASE 11/25/2009   right carotid 60-79% July 2011  . PSA, INCREASED 11/14/2007  . 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 COLONOSCOPY    . INGUINAL HERNIA REPAIR   2001  . MASS EXCISION  04/06/2011   Procedure: EXCISION MASS;  Surgeon: Judieth Keens, DO;  Location: [facility];  Service: General;  Laterality: Right;  excision right  upper back mass  . PROSTATE BIOPSY  Fall 2009   S/P-neg-Dr. Jeffie Pollock    reports that he quit smoking about 59 years ago. He has never used smokeless tobacco. He reports current alcohol use. He reports that he does not use drugs. family history includes Cancer in his mother and another family member; Depression in his mother; Heart disease in his father; Stroke in his father. No Known Allergies Current Outpatient Medications on File Prior to Visit  Medication Sig Dispense Refill  . Acetaminophen (TYLENOL ARTHRITIS PAIN PO) Take by mouth daily.    Marland Kitchen amLODipine (NORVASC) 5 MG tablet Take 1 tablet by mouth once daily 90 tablet 3  . calcium & magnesium carbonates (MYLANTA) [id number] MG per tablet Take 1 tablet by mouth daily.    . cholecalciferol (VITAMIN D) 1000 units tablet Take 5,000 Units by mouth daily.     . finasteride (PROSCAR) 5 MG tablet Take 5 mg by mouth daily.      . Multiple Vitamin (MULTIVITAMIN) tablet Take 1 tablet by mouth daily.      . Omega-3 Fatty Acids (FISH OIL) 1000 MG CAPS Take by mouth.    Marland Kitchen omeprazole (PRILOSEC) 20 MG capsule TAKE 1 CAPSULE BY MOUTH ONCE  DAILY AS NEEDED 90 capsule 0  . rosuvastatin (CRESTOR) 20 MG tablet Take 1 tablet (20 mg total) by mouth daily. 90 tablet 1  . vitamin C (ASCORBIC ACID) 500 MG tablet Take 500 mg by mouth daily.     No current facility-administered medications on file prior to visit.   Review of Systems  Constitutional: Negative for other unusual diaphoresis or sweats HENT: Negative for ear discharge or swelling Eyes: Negative for other worsening visual disturbances Respiratory: Negative for stridor or other swelling  Gastrointestinal: Negative for worsening distension or other blood Genitourinary: Negative for retention or other urinary  change Musculoskeletal: Negative for other MSK pain or swelling Skin: Negative for color change or other new lesions Neurological: Negative for worsening tremors and other numbness  Psychiatric/Behavioral: Negative for worsening agitation or other fatigue All otherwise neg per pt     Objective:   Physical Exam BP (!) 160/90   Pulse 98   Temp 98.9 F (37.2 C)   Resp 12   Ht 5\' 8"  (1.727 m)   Wt 193 lb 12.8 oz (87.9 kg)   SpO2 99%   BMI 29.47 kg/m  VS noted,  Constitutional: Pt appears in NAD HENT: Head: NCAT.  Right Ear: External ear normal.  Left Ear: External ear normal.  Eyes: . Pupils are equal, round, and reactive to light. Conjunctivae and EOM are normal Nose: without d/c or deformity Neck: Neck supple. Gross normal ROM Cardiovascular: Normal rate and regular rhythm.   Pulmonary/Chest: Effort normal and breath sounds without rales or wheezing.  Abd:  Soft, NT, ND, + BS, no organomegaly Neurological: Pt is alert. At baseline orientation, motor grossly intact Skin: Skin is warm. No rashes, other new lesions, no LE edema Psychiatric: Pt behavior is normal without agitation  All otherwise neg per pt Lab Results  Component Value Date   WBC 6.7 11/08/2018   HGB 15.9 11/08/2018   HCT 46.6 11/08/2018   PLT 276.0 11/08/2018   GLUCOSE 101 (H) 11/08/2018   CHOL 136 11/08/2018   TRIG 69.0 11/08/2018   HDL 69.70 11/08/2018   LDLCALC 53 11/08/2018   ALT 16 11/08/2018   AST 19 11/08/2018   NA 140 11/08/2018   K 4.3 11/08/2018   CL 105 11/08/2018   CREATININE 1.12 11/08/2018   BUN 21 11/08/2018   CO2 25 11/08/2018   TSH 0.98 11/08/2018   PSA 1.57 11/08/2018       Assessment & Plan:

## 2019-04-29 NOTE — Patient Instructions (Signed)
Please take all new medication as prescribed - the losartan 100 mg per day, and the tramadol for pain  Please cut to half pill if you have unusual persistent dizziness or weakness or trying to fall down  Please continue all other medications as before,  Please have the pharmacy call with any other refills you may need.  Please continue your efforts at being more active, low cholesterol diet, and weight control.  Please keep your appointments with your specialists as you may have planned  Please return in 1 week

## 2019-05-06 ENCOUNTER — Other Ambulatory Visit: Payer: Self-pay

## 2019-05-06 ENCOUNTER — Ambulatory Visit (INDEPENDENT_AMBULATORY_CARE_PROVIDER_SITE_OTHER): Payer: Medicare Other | Admitting: Internal Medicine

## 2019-05-06 ENCOUNTER — Encounter: Payer: Self-pay | Admitting: Internal Medicine

## 2019-05-06 VITALS — BP 168/88 | HR 67 | Temp 97.6°F | Ht 67.0 in | Wt 199.0 lb

## 2019-05-06 DIAGNOSIS — F419 Anxiety disorder, unspecified: Secondary | ICD-10-CM | POA: Diagnosis not present

## 2019-05-06 DIAGNOSIS — G5602 Carpal tunnel syndrome, left upper limb: Secondary | ICD-10-CM | POA: Diagnosis not present

## 2019-05-06 DIAGNOSIS — I10 Essential (primary) hypertension: Secondary | ICD-10-CM

## 2019-05-06 MED ORDER — LABETALOL HCL 200 MG PO TABS: 200.0000 mg | ORAL_TABLET | Freq: Two times a day (BID) | ORAL | 11 refills | Status: DC

## 2019-05-06 NOTE — Patient Instructions (Signed)
Please take all new medication as prescribed - the labetolol 200 mg twice per day  Please continue all other medications as before, and refills have been done if requested.  Please have the pharmacy call with any other refills you may need.  Please continue your efforts at being more active, low cholesterol diet, and weight control.  Please keep your appointments with your specialists as you may have planned  Please return on Friday Jan 8 for BP recheck

## 2019-05-06 NOTE — Progress Notes (Signed)
Subjective:    Patient ID: Brandon [name], male    DOB: 11-28-1940, 79 y.o.   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   BP Readings from Last 3 Encounters:  05/06/19 (!) 168/88  04/29/19 (!) 160/90  11/13/18 134/88  Pt denies chest pain, increased sob or doe, wheezing, orthopnea, PND, increased LE swelling, palpitations, dizziness or syncope.  Pt denies new neurological symptoms such as new headache, or facial or extremity weakness or numbness   Pt denies polydipsia, polyuria, or low sugar symptoms such as weakness or confusion improved with po intake.  Pt states overall good compliance with meds, trying to follow lower cholesterol, diabetic diet, wt overall stable but little exercise however.  Denies worsening depressive symptoms, suicidal ideation, or panic; has ongoing anxiety, now worse situationally with this conondrum Past Medical History:  Diagnosis Date  . 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 BENIGN PROSTATIC HYPERTROPHY 11/15/2006  . 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 COLONIC POLYPS, HX OF 11/15/2006  . ERECTILE DYSFUNCTION 11/15/2006  . GERD 11/15/2006  . HYPERLIPIDEMIA 11/15/2006  . HYPERTENSION 11/15/2006   borderline  . Lipoma 11/29/2010  . LOW BACK PAIN 11/14/2007   recurrent  . PARESTHESIA, HANDS 11/23/2008  . PERIPHERAL VASCULAR DISEASE 11/25/2009   right carotid 60-79% July 2011  . PSA, INCREASED 11/14/2007  . 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 COLONOSCOPY    . INGUINAL HERNIA REPAIR  2001  . MASS EXCISION  04/06/2011   Procedure: EXCISION MASS;  Surgeon: Judieth Keens, DO;  Location: [facility];  Service: General;  Laterality: Right;  excision right  upper back mass  . PROSTATE BIOPSY  Fall 2009   S/P-neg-Dr.  Jeffie Pollock    reports that he quit smoking about 59 years ago. He has never used smokeless tobacco. He reports current alcohol use. He reports that he does not use drugs. family history includes Cancer in his mother and another family member; Depression in his mother; Heart disease in his father; Stroke in his father. No Known Allergies Current Outpatient Medications on File Prior to Visit  Medication Sig Dispense Refill  . Acetaminophen (TYLENOL ARTHRITIS PAIN PO) Take by mouth daily.    Marland Kitchen amLODipine (NORVASC) 5 MG tablet Take 1 tablet by mouth once daily 90 tablet 3  . calcium & magnesium carbonates (MYLANTA) [id number] MG per tablet Take 1 tablet by mouth daily.    . cholecalciferol (VITAMIN D) 1000 units tablet Take 5,000 Units by mouth daily.     . finasteride (PROSCAR) 5 MG tablet Take 5 mg by mouth daily.      Marland Kitchen losartan (COZAAR) 100 MG tablet Take 1 tablet (100 mg total) by mouth daily. 90 tablet 3  . Multiple Vitamin (MULTIVITAMIN) tablet Take 1 tablet by mouth daily.      . Omega-3 Fatty Acids (FISH OIL) 1000 MG CAPS Take by mouth.    Marland Kitchen omeprazole (PRILOSEC) 20 MG capsule TAKE 1 CAPSULE BY MOUTH ONCE DAILY AS NEEDED 90 capsule 0  . rosuvastatin (CRESTOR) 20 MG tablet Take 1 tablet (20 mg total) by mouth daily. 90 tablet 1  . vitamin C (ASCORBIC ACID) 500 MG tablet Take 500 mg by mouth daily.     No current facility-administered medications  on file prior to visit.   Review of Systems  Constitutional: Negative for other unusual diaphoresis or sweats HENT: Negative for ear discharge or swelling Eyes: Negative for other worsening visual disturbances Respiratory: Negative for stridor or other swelling  Gastrointestinal: Negative for worsening distension or other blood Genitourinary: Negative for retention or other urinary change Musculoskeletal: Negative for other MSK pain or swelling Skin: Negative for color change or other new lesions Neurological: Negative for worsening tremors and  other numbness  Psychiatric/Behavioral: Negative for worsening agitation or other fatigue All otherwise neg per pt     Objective:   Physical Exam BP (!) 168/88   Pulse 67   Temp 97.6 F (36.4 C)   Ht 5\' 7"  (1.702 m)   Wt 199 lb (90.3 kg)   SpO2 97%   BMI 31.17 kg/m  VS noted,  Constitutional: Pt appears in NAD HENT: Head: NCAT.  Right Ear: External ear normal.  Left Ear: External ear normal.  Eyes: . Pupils are equal, round, and reactive to light. Conjunctivae and EOM are normal Nose: without d/c or deformity Neck: Neck supple. Gross normal ROM Cardiovascular: Normal rate and regular rhythm.   Pulmonary/Chest: Effort normal and breath sounds without rales or wheezing.  Neurological: Pt is alert. At baseline orientation, motor grossly intact Skin: Skin is warm. No rashes, other new lesions, no LE edema Psychiatric: Pt behavior is normal without agitation . 2+ nervous All otherwise neg per pt Lab Results  Component Value Date   WBC 6.7 11/08/2018   HGB 15.9 11/08/2018   HCT 46.6 11/08/2018   PLT 276.0 11/08/2018   GLUCOSE 101 (H) 11/08/2018   CHOL 136 11/08/2018   TRIG 69.0 11/08/2018   HDL 69.70 11/08/2018   LDLCALC 53 11/08/2018   ALT 16 11/08/2018   AST 19 11/08/2018   NA 140 11/08/2018   K 4.3 11/08/2018   CL 105 11/08/2018   CREATININE 1.12 11/08/2018   BUN 21 11/08/2018   CO2 25 11/08/2018   TSH 0.98 11/08/2018   PSA 1.57 11/08/2018          Assessment & Plan:

## 2019-05-09 ENCOUNTER — Ambulatory Visit (INDEPENDENT_AMBULATORY_CARE_PROVIDER_SITE_OTHER): Payer: Medicare Other | Admitting: Internal Medicine

## 2019-05-09 ENCOUNTER — Other Ambulatory Visit: Payer: Self-pay

## 2019-05-09 ENCOUNTER — Encounter: Payer: Self-pay | Admitting: Internal Medicine

## 2019-05-09 VITALS — BP 138/86 | HR 65 | Temp 98.0°F | Resp 12 | Wt 199.0 lb

## 2019-05-09 DIAGNOSIS — I10 Essential (primary) hypertension: Secondary | ICD-10-CM

## 2019-05-09 DIAGNOSIS — Z01818 Encounter for other preprocedural examination: Secondary | ICD-10-CM | POA: Diagnosis not present

## 2019-05-09 DIAGNOSIS — G5602 Carpal tunnel syndrome, left upper limb: Secondary | ICD-10-CM

## 2019-05-09 NOTE — Patient Instructions (Signed)
Please continue all other medications as before, and refills have been done if requested.  Please have the pharmacy call with any other refills you may need.  Please continue your efforts at being more active, low cholesterol diet, and weight control.  Please keep your appointments with your specialists as you may have planned  You are given the note to take to the surgeons today

## 2019-05-09 NOTE — Progress Notes (Signed)
Subjective:    Patient ID: Brandon [name], male    DOB: [birth year], 79 y.o.   MRN: [id number]  HPI  Here to f/u; overall doing ok,  Pt denies chest pain, increasing sob or doe, wheezing, orthopnea, PND, increased LE swelling, palpitations, dizziness or syncope.  Pt denies new neurological symptoms such as new headache, or facial or extremity weakness or numbness.  Pt denies polydipsia, polyuria, or low sugar episode.  Pt states overall good compliance with meds, mostly trying to follow appropriate diet, with wt overall stable,  but little exercise however. Good compliance with current meds.  No new complaints. Due for left CTS surgury next Tuesday .pchxx Current Outpatient Medications on File Prior to Visit  Medication Sig Dispense Refill  . Acetaminophen (TYLENOL ARTHRITIS PAIN PO) Take by mouth daily.    Marland Kitchen amLODipine (NORVASC) 5 MG tablet Take 1 tablet by mouth once daily 90 tablet 3  . calcium & magnesium carbonates (MYLANTA) [id number] MG per tablet Take 1 tablet by mouth daily.    . finasteride (PROSCAR) 5 MG tablet Take 5 mg by mouth daily.      Marland Kitchen labetalol (NORMODYNE) 200 MG tablet Take 1 tablet (200 mg total) by mouth 2 (two) times daily. 60 tablet 11  . losartan (COZAAR) 100 MG tablet Take 1 tablet (100 mg total) by mouth daily. 90 tablet 3  . omeprazole (PRILOSEC) 20 MG capsule TAKE 1 CAPSULE BY MOUTH ONCE DAILY AS NEEDED 90 capsule 0  . rosuvastatin (CRESTOR) 20 MG tablet Take 1 tablet (20 mg total) by mouth daily. 90 tablet 1  . cholecalciferol (VITAMIN D) 1000 units tablet Take 5,000 Units by mouth daily.     . Multiple Vitamin (MULTIVITAMIN) tablet Take 1 tablet by mouth daily.      . Omega-3 Fatty Acids (FISH OIL) 1000 MG CAPS Take by mouth.    . vitamin C (ASCORBIC ACID) 500 MG tablet Take 500 mg by mouth daily.     No current facility-administered medications on file prior to visit.   Review of Systems  Constitutional: Negative for other unusual diaphoresis or sweats HENT:  Negative for ear discharge or swelling Eyes: Negative for other worsening visual disturbances Respiratory: Negative for stridor or other swelling  Gastrointestinal: Negative for worsening distension or other blood Genitourinary: Negative for retention or other urinary change Musculoskeletal: Negative for other MSK pain or swelling Skin: Negative for color change or other new lesions Neurological: Negative for worsening tremors and other numbness  Psychiatric/Behavioral: Negative for worsening agitation or other fatigue All otherwise neg per pt     Objective:   Physical Exam BP 138/86   Pulse 65   Temp 98 F (36.7 C)   Resp 12   Wt 199 lb (90.3 kg)   SpO2 97%   BMI 31.17 kg/m  VS noted,  Constitutional: Pt appears in NAD HENT: Head: NCAT.  Right Ear: External ear normal.  Left Ear: External ear normal.  Eyes: . Pupils are equal, round, and reactive to light. Conjunctivae and EOM are normal Nose: without d/c or deformity Neck: Neck supple. Gross normal ROM Cardiovascular: Normal rate and regular rhythm.   Pulmonary/Chest: Effort normal and breath sounds without rales or wheezing.  Abd:  Soft, NT, ND, + BS, no organomegaly Neurological: Pt is alert. At baseline orientation, motor grossly intact Skin: Skin is warm. No rashes, other new lesions, no LE edema Psychiatric: Pt behavior is normal without agitation  All otherwise neg per pt  Lab Results  Component Value Date   WBC 6.7 11/08/2018   HGB 15.9 11/08/2018   HCT 46.6 11/08/2018   PLT 276.0 11/08/2018   GLUCOSE 101 (H) 11/08/2018   CHOL 136 11/08/2018   TRIG 69.0 11/08/2018   HDL 69.70 11/08/2018   LDLCALC 53 11/08/2018   ALT 16 11/08/2018   AST 19 11/08/2018   NA 140 11/08/2018   K 4.3 11/08/2018   CL 105 11/08/2018   CREATININE 1.12 11/08/2018   BUN 21 11/08/2018   CO2 25 11/08/2018   TSH 0.98 11/08/2018   PSA 1.57 11/08/2018      Assessment & Plan:

## 2019-05-10 ENCOUNTER — Encounter: Payer: Self-pay | Admitting: Internal Medicine

## 2019-05-10 NOTE — Assessment & Plan Note (Signed)
stable overall by history and exam, recent data reviewed with pt, and pt to continue medical treatment as before,  to f/u any worsening symptoms or concerns  

## 2019-05-10 NOTE — Assessment & Plan Note (Signed)
Persistently symptomatic, declines change in tx for pain or o/w

## 2019-05-10 NOTE — Assessment & Plan Note (Signed)
Pt cleared for surgury as planned 

## 2019-05-11 ENCOUNTER — Encounter: Payer: Self-pay | Admitting: Internal Medicine

## 2019-05-11 DIAGNOSIS — F419 Anxiety disorder, unspecified: Secondary | ICD-10-CM | POA: Insufficient documentation

## 2019-05-11 NOTE — Assessment & Plan Note (Signed)
Uncontrolled, for add labetolol 200 bid and f.u here in 3 days, for then hopeful ok for surgury next tues jan 11

## 2019-05-11 NOTE — Assessment & Plan Note (Signed)
Mild situational, cont same tx

## 2019-05-11 NOTE — Assessment & Plan Note (Signed)
Tramadol not working well and just makes him sleepy, declines other pain med for now,  to f/u any worsening symptoms or concerns

## 2019-05-16 DIAGNOSIS — G8918 Other acute postprocedural pain: Secondary | ICD-10-CM | POA: Diagnosis not present

## 2019-05-16 DIAGNOSIS — G5602 Carpal tunnel syndrome, left upper limb: Secondary | ICD-10-CM | POA: Diagnosis not present

## 2019-05-22 DIAGNOSIS — Z4789 Encounter for other orthopedic aftercare: Secondary | ICD-10-CM | POA: Diagnosis not present

## 2019-05-23 ENCOUNTER — Telehealth: Payer: Self-pay | Admitting: Internal Medicine

## 2019-05-23 NOTE — Telephone Encounter (Signed)
Yes, that would be no problem as they dont interfere with each other

## 2019-05-23 NOTE — Telephone Encounter (Signed)
  Patient wants to know if he should take the COVID vaccine while taking antibiotics  Please advise

## 2019-05-26 NOTE — Telephone Encounter (Signed)
Notified pt w/MD response.../lmb 

## 2019-05-29 DIAGNOSIS — G5602 Carpal tunnel syndrome, left upper limb: Secondary | ICD-10-CM | POA: Diagnosis not present

## 2019-06-02 ENCOUNTER — Encounter: Payer: Self-pay | Admitting: Internal Medicine

## 2019-06-02 ENCOUNTER — Other Ambulatory Visit: Payer: Self-pay

## 2019-06-02 ENCOUNTER — Ambulatory Visit (INDEPENDENT_AMBULATORY_CARE_PROVIDER_SITE_OTHER): Payer: Medicare Other | Admitting: Internal Medicine

## 2019-06-02 VITALS — BP 146/82 | HR 56 | Temp 97.8°F | Ht 67.0 in | Wt 196.6 lb

## 2019-06-02 DIAGNOSIS — R600 Localized edema: Secondary | ICD-10-CM | POA: Insufficient documentation

## 2019-06-02 DIAGNOSIS — F419 Anxiety disorder, unspecified: Secondary | ICD-10-CM | POA: Diagnosis not present

## 2019-06-02 DIAGNOSIS — I10 Essential (primary) hypertension: Secondary | ICD-10-CM | POA: Diagnosis not present

## 2019-06-02 DIAGNOSIS — E785 Hyperlipidemia, unspecified: Secondary | ICD-10-CM

## 2019-06-02 DIAGNOSIS — I5030 Unspecified diastolic (congestive) heart failure: Secondary | ICD-10-CM

## 2019-06-02 DIAGNOSIS — R609 Edema, unspecified: Secondary | ICD-10-CM | POA: Insufficient documentation

## 2019-06-02 MED ORDER — FUROSEMIDE 20 MG PO TABS: 20.0000 mg | ORAL_TABLET | Freq: Every day | ORAL | 11 refills | Status: DC | PRN

## 2019-06-02 NOTE — Patient Instructions (Signed)
OK to restart the labetolol as prescribed and continue all medications as before for blood pressure  Please take all new medication as prescribed - the lasix 20 mg as needed for persistent swelling  Please continue all other medications as before, and refills have been done if requested.  Please have the pharmacy call with any other refills you may need.  Please continue your efforts at being more active, low cholesterol diet, low salt, and weight control  Please keep your appointments with your specialists as you may have planned  Your EKG was OK today  You will be contacted regarding the referral for: echocardiogram

## 2019-06-02 NOTE — Progress Notes (Signed)
Subjective:    Patient ID: Brandon [name], male    DOB: 12/09/1940, 79 y.o.   MRN: [id number]  HPI  Here to f/u; overall doing ok,  Pt denies chest pain, increasing sob or doe, wheezing, orthopnea, PND, , palpitations, dizziness or syncope, but has had increased bilateral left > right leg swelling for 10 days, starting about 5 days after left CTS surgury, has been less active.  Pt denies new neurological symptoms such as new headache, or facial or extremity weakness or numbness.  Pt denies polydipsia, polyuria, Skipped some labetolol as online source told him could cause swelling Wt Readings from Last 3 Encounters:  06/02/19 196 lb 9.6 oz (89.2 kg)  05/09/19 199 lb (90.3 kg)  05/06/19 199 lb (90.3 kg)   Past Medical History:  Diagnosis Date  . 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 BENIGN PROSTATIC HYPERTROPHY 11/15/2006  . 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 COLONIC POLYPS, HX OF 11/15/2006  . ERECTILE DYSFUNCTION 11/15/2006  . GERD 11/15/2006  . HYPERLIPIDEMIA 11/15/2006  . HYPERTENSION 11/15/2006   borderline  . Lipoma 11/29/2010  . LOW BACK PAIN 11/14/2007   recurrent  . PARESTHESIA, HANDS 11/23/2008  . PERIPHERAL VASCULAR DISEASE 11/25/2009   right carotid 60-79% July 2011  . PSA, INCREASED 11/14/2007  . 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 COLONOSCOPY    . INGUINAL HERNIA REPAIR  2001  . MASS EXCISION  04/06/2011   Procedure: EXCISION MASS;  Surgeon: Judieth Keens, DO;  Location: [facility];  Service: General;  Laterality: Right;  excision right  upper back mass  . PROSTATE BIOPSY  Fall 2009   S/P-neg-Dr. Jeffie Pollock    reports that he quit smoking about 59 years ago. He has never used smokeless tobacco. He reports current alcohol use. He reports that he does not use drugs. family history includes Cancer in his mother and another  family member; Depression in his mother; Heart disease in his father; Stroke in his father. No Known Allergies Current Outpatient Medications on File Prior to Visit  Medication Sig Dispense Refill  . Acetaminophen (TYLENOL ARTHRITIS PAIN PO) Take by mouth daily.    Marland Kitchen amLODipine (NORVASC) 5 MG tablet Take 1 tablet by mouth once daily 90 tablet 3  . calcium & magnesium carbonates (MYLANTA) [id number] MG per tablet Take 1 tablet by mouth daily.    . cholecalciferol (VITAMIN D) 1000 units tablet Take 5,000 Units by mouth daily.     . finasteride (PROSCAR) 5 MG tablet Take 5 mg by mouth daily.      Marland Kitchen labetalol (NORMODYNE) 200 MG tablet Take 1 tablet (200 mg total) by mouth 2 (two) times daily. 60 tablet 11  . losartan (COZAAR) 100 MG tablet Take 1 tablet (100 mg total) by mouth daily. 90 tablet 3  . Multiple Vitamin (MULTIVITAMIN) tablet Take 1 tablet by mouth daily.      . Omega-3 Fatty Acids (FISH OIL) 1000 MG CAPS Take by mouth.    Marland Kitchen omeprazole (PRILOSEC) 20 MG capsule TAKE 1 CAPSULE BY MOUTH ONCE DAILY AS NEEDED 90 capsule 0  . rosuvastatin (CRESTOR) 20 MG tablet Take 1 tablet (20 mg total) by mouth daily. 90 tablet 1  . vitamin C (ASCORBIC ACID) 500 MG tablet Take 500 mg by mouth daily.    Marland Kitchen oxyCODONE (OXY IR/ROXICODONE) 5 MG immediate release tablet oxycodone 5 mg  tablet     No current facility-administered medications on file prior to visit.   Review of Systems  Constitutional: Negative for other unusual diaphoresis or sweats HENT: Negative for ear discharge or swelling Eyes: Negative for other worsening visual disturbances Respiratory: Negative for stridor or other swelling  Gastrointestinal: Negative for worsening distension or other blood Genitourinary: Negative for retention or other urinary change Musculoskeletal: Negative for other MSK pain or swelling Skin: Negative for color change or other new lesions Neurological: Negative for worsening tremors and other numbness   Psychiatric/Behavioral: Negative for worsening agitation or other fatigue All otherwise neg per pt     Objective:   Physical Exam BP (!) 146/82 (BP Location: Left Arm, Patient Position: Sitting, Cuff Size: Normal)   Pulse (!) 56   Temp 97.8 F (36.6 C) (Oral)   Ht 5\' 7"  (1.702 m)   Wt 196 lb 9.6 oz (89.2 kg)   SpO2 98%   BMI 30.79 kg/m  VS noted,  Constitutional: Pt appears in NAD HENT: Head: NCAT.  Right Ear: External ear normal.  Left Ear: External ear normal.  Eyes: . Pupils are equal, round, and reactive to light. Conjunctivae and EOM are normal Nose: without d/c or deformity Neck: Neck supple. Gross normal ROM Cardiovascular: Normal rate and regular rhythm.   Pulmonary/Chest: Effort normal and breath sounds without rales or wheezing.  Abd:  Soft, NT, ND, + BS, no organomegaly Neurological: Pt is alert. At baseline orientation, motor grossly intact Skin: Skin is warm. No rashes, other new lesions, trace to 1+ left > right LE edema  Psychiatric: Pt behavior is normal without agitation  All otherwise neg per pt Lab Results  Component Value Date   WBC 6.7 11/08/2018   HGB 15.9 11/08/2018   HCT 46.6 11/08/2018   PLT 276.0 11/08/2018   GLUCOSE 101 (H) 11/08/2018   CHOL 136 11/08/2018   TRIG 69.0 11/08/2018   HDL 69.70 11/08/2018   LDLCALC 53 11/08/2018   ALT 16 11/08/2018   AST 19 11/08/2018   NA 140 11/08/2018   K 4.3 11/08/2018   CL 105 11/08/2018   CREATININE 1.12 11/08/2018   BUN 21 11/08/2018   CO2 25 11/08/2018   TSH 0.98 11/08/2018   PSA 1.57 11/08/2018          Assessment & Plan:

## 2019-06-02 NOTE — Assessment & Plan Note (Addendum)
Etiology unclear, declines bnp, for echo and lasxi 20 qd prn  I spent  33 minutes preparing to see the patient by review of recent labs, imaging and procedures, obtaining and reviewing separately obtained history, communicating with the patient and family or caregiver, ordering medications, tests or procedures, and documenting clinical information in the EHR including the differential Dx, treatment, and any further evaluation and other management of edema, HLD, HTN, anxiety

## 2019-06-02 NOTE — Assessment & Plan Note (Signed)
stable overall by history and exam, recent data reviewed with pt, and pt to continue medical treatment as before,  to f/u any worsening symptoms or concerns  

## 2019-06-02 NOTE — Assessment & Plan Note (Signed)
Ongoing issue but improved today after left cts surgury pain very much improved

## 2019-06-02 NOTE — Telephone Encounter (Signed)
Pt has made appt for this afternoon w/provider @ 2:20.Marland KitchenJohny [name]

## 2019-06-02 NOTE — Assessment & Plan Note (Signed)
To restart and take all meds as prescribed

## 2019-06-06 DIAGNOSIS — M79642 Pain in left hand: Secondary | ICD-10-CM | POA: Diagnosis not present

## 2019-06-09 DIAGNOSIS — M79642 Pain in left hand: Secondary | ICD-10-CM | POA: Diagnosis not present

## 2019-06-10 ENCOUNTER — Telehealth: Payer: Self-pay

## 2019-06-10 NOTE — Telephone Encounter (Signed)
Pls advise if ok to write letter.Marland KitchenJohny [name]

## 2019-06-10 NOTE — Telephone Encounter (Signed)
Called [facility]. Mailed letter to patient

## 2019-06-10 NOTE — Telephone Encounter (Signed)
New message   The patient has an upcoming COVID vaccine appt on 2/11 @ 6:55 pm.  The questionnaire is asking about blood pressure issues.   The patient will need a note from the Physician stating it's okay for him to take the vaccine.    The patient can retrieve the letter  from his my chart account.

## 2019-06-10 NOTE — Telephone Encounter (Signed)
[place] for letter - done

## 2019-06-13 DIAGNOSIS — M79642 Pain in left hand: Secondary | ICD-10-CM | POA: Diagnosis not present

## 2019-06-16 ENCOUNTER — Other Ambulatory Visit: Payer: Self-pay

## 2019-06-16 ENCOUNTER — Ambulatory Visit (HOSPITAL_COMMUNITY): Payer: Medicare Other | Attending: Cardiology

## 2019-06-16 DIAGNOSIS — I5030 Unspecified diastolic (congestive) heart failure: Secondary | ICD-10-CM | POA: Diagnosis not present

## 2019-06-17 DIAGNOSIS — M79642 Pain in left hand: Secondary | ICD-10-CM | POA: Diagnosis not present

## 2019-06-19 DIAGNOSIS — M79642 Pain in left hand: Secondary | ICD-10-CM | POA: Diagnosis not present

## 2019-06-19 DIAGNOSIS — G5601 Carpal tunnel syndrome, right upper limb: Secondary | ICD-10-CM | POA: Diagnosis not present

## 2019-06-19 DIAGNOSIS — Z4789 Encounter for other orthopedic aftercare: Secondary | ICD-10-CM | POA: Diagnosis not present

## 2019-06-19 DIAGNOSIS — G5603 Carpal tunnel syndrome, bilateral upper limbs: Secondary | ICD-10-CM | POA: Diagnosis not present

## 2019-06-24 DIAGNOSIS — M79642 Pain in left hand: Secondary | ICD-10-CM | POA: Diagnosis not present

## 2019-06-27 DIAGNOSIS — M79642 Pain in left hand: Secondary | ICD-10-CM | POA: Diagnosis not present

## 2019-07-04 DIAGNOSIS — M79642 Pain in left hand: Secondary | ICD-10-CM | POA: Diagnosis not present

## 2019-07-11 DIAGNOSIS — M79642 Pain in left hand: Secondary | ICD-10-CM | POA: Diagnosis not present

## 2019-07-12 ENCOUNTER — Other Ambulatory Visit: Payer: Self-pay | Admitting: Internal Medicine

## 2019-07-12 NOTE — Telephone Encounter (Signed)
Please refill as per office routine med refill policy (all routine meds refilled for 3 mo or monthly per pt preference up to one year from last visit, then month to month grace period for 3 mo, then further med refills will have to be denied)  

## 2019-07-17 DIAGNOSIS — M79642 Pain in left hand: Secondary | ICD-10-CM | POA: Diagnosis not present

## 2019-07-23 ENCOUNTER — Encounter: Payer: Self-pay | Admitting: Internal Medicine

## 2019-07-23 ENCOUNTER — Other Ambulatory Visit: Payer: Self-pay

## 2019-07-23 ENCOUNTER — Ambulatory Visit (INDEPENDENT_AMBULATORY_CARE_PROVIDER_SITE_OTHER): Payer: Medicare Other

## 2019-07-23 ENCOUNTER — Ambulatory Visit (INDEPENDENT_AMBULATORY_CARE_PROVIDER_SITE_OTHER): Payer: Medicare Other | Admitting: Internal Medicine

## 2019-07-23 VITALS — BP 130/82 | HR 56 | Temp 97.9°F | Ht 67.0 in | Wt 193.6 lb

## 2019-07-23 DIAGNOSIS — Z0000 Encounter for general adult medical examination without abnormal findings: Secondary | ICD-10-CM

## 2019-07-23 DIAGNOSIS — F419 Anxiety disorder, unspecified: Secondary | ICD-10-CM

## 2019-07-23 DIAGNOSIS — M25572 Pain in left ankle and joints of left foot: Secondary | ICD-10-CM | POA: Insufficient documentation

## 2019-07-23 DIAGNOSIS — I10 Essential (primary) hypertension: Secondary | ICD-10-CM

## 2019-07-23 DIAGNOSIS — R600 Localized edema: Secondary | ICD-10-CM | POA: Diagnosis not present

## 2019-07-23 MED ORDER — METOPROLOL SUCCINATE ER 50 MG PO TB24: 50.0000 mg | ORAL_TABLET | Freq: Every day | ORAL | 3 refills | Status: DC

## 2019-07-23 NOTE — Progress Notes (Signed)
Subjective:    Patient ID: Brandon [name], male    DOB: 1941/02/13, 79 y.o.   MRN: [id number]  HPI  Here to f/u; overall doing ok,  Pt denies chest pain, increasing sob or doe, wheezing, orthopnea, PND, increased LE swelling, palpitations, dizziness or syncope.  Pt denies new neurological symptoms such as new headache, or facial or extremity weakness or numbness.  Pt denies polydipsia, polyuria, or low sugar episode.  Pt states overall good compliance with meds, mostly trying to follow appropriate diet, with wt overall stable,  but little exercise however. Asking for change of bid labetolol to qd med.  BP at home similar to today.  Also has mild left ankle swelling with recurrent pain with walking,, better to sit Past Medical History:  Diagnosis Date  . 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 BENIGN PROSTATIC HYPERTROPHY 11/15/2006  . 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 COLONIC POLYPS, HX OF 11/15/2006  . ERECTILE DYSFUNCTION 11/15/2006  . GERD 11/15/2006  . HYPERLIPIDEMIA 11/15/2006  . HYPERTENSION 11/15/2006   borderline  . Lipoma 11/29/2010  . LOW BACK PAIN 11/14/2007   recurrent  . PARESTHESIA, HANDS 11/23/2008  . PERIPHERAL VASCULAR DISEASE 11/25/2009   right carotid 60-79% July 2011  . PSA, INCREASED 11/14/2007  . 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 COLONOSCOPY    . INGUINAL HERNIA REPAIR  2001  . MASS EXCISION  04/06/2011   Procedure: EXCISION MASS;  Surgeon: Judieth Keens, DO;  Location: [facility];  Service: General;  Laterality: Right;  excision right  upper back mass  . PROSTATE BIOPSY  Fall 2009   S/P-neg-Dr. Jeffie Pollock    reports that he quit smoking about 59 years ago. He has never used smokeless tobacco. He reports current alcohol use. He reports that he does not use drugs. family history includes Cancer in his mother and another  family member; Depression in his mother; Heart disease in his father; Stroke in his father. No Known Allergies Current Outpatient Medications on File Prior to Visit  Medication Sig Dispense Refill  . Acetaminophen (TYLENOL ARTHRITIS PAIN PO) Take by mouth daily.    Marland Kitchen amLODipine (NORVASC) 5 MG tablet Take 1 tablet by mouth once daily 90 tablet 3  . calcium & magnesium carbonates (MYLANTA) [id number] MG per tablet Take 1 tablet by mouth daily.    . cholecalciferol (VITAMIN D) 1000 units tablet Take 5,000 Units by mouth daily.     . finasteride (PROSCAR) 5 MG tablet Take 5 mg by mouth daily.      . furosemide (LASIX) 20 MG tablet Take 1 tablet (20 mg total) by mouth daily as needed for edema. 30 tablet 11  . losartan (COZAAR) 100 MG tablet Take 1 tablet (100 mg total) by mouth daily. 90 tablet 3  . Multiple Vitamin (MULTIVITAMIN) tablet Take 1 tablet by mouth daily.      Marland Kitchen omeprazole (PRILOSEC) 20 MG capsule TAKE 1 CAPSULE BY MOUTH ONCE DAILY AS NEEDED 90 capsule 0  . Pyridoxine HCl (VITAMIN B-6) 500 MG tablet Take 500 mg by mouth daily.    . rosuvastatin (CRESTOR) 20 MG tablet Take 1 tablet by mouth once daily 90 tablet 2  . vitamin C (ASCORBIC ACID) 500 MG tablet Take 500 mg by mouth daily.    . Omega-3 Fatty Acids (FISH OIL) 1000 MG CAPS Take by mouth.    Marland Kitchen  oxyCODONE (OXY IR/ROXICODONE) 5 MG immediate release tablet oxycodone 5 mg tablet     No current facility-administered medications on file prior to visit.   Review of Systems All otherwise neg per pt     Objective:   Physical Exam BP 130/82   Pulse (!) 56   Temp 97.9 F (36.6 C)   Ht 5\' 7"  (1.702 m)   Wt 193 lb 9.6 oz (87.8 kg)   SpO2 99%   BMI 30.32 kg/m  VS noted,  Constitutional: Pt appears in NAD HENT: Head: NCAT.  Right Ear: External ear normal.  Left Ear: External ear normal.  Eyes: . Pupils are equal, round, and reactive to light. Conjunctivae and EOM are normal Nose: without d/c or deformity Neck: Neck supple. Gross  normal ROM Cardiovascular: Normal rate and regular rhythm.   Pulmonary/Chest: Effort normal and breath sounds without rales or wheezing.  Abd:  Soft, NT, ND, + BS, no organomegaly Neurological: Pt is alert. At baseline orientation, motor grossly intact Skin: Skin is warm. No rashes, other new lesions, no LE edema Psychiatric: Pt behavior is normal without agitation  All otherwise neg per pt except left ankle with 1-2+ local swelling Lab Results  Component Value Date   WBC 6.7 11/08/2018   HGB 15.9 11/08/2018   HCT 46.6 11/08/2018   PLT 276.0 11/08/2018   GLUCOSE 101 (H) 11/08/2018   CHOL 136 11/08/2018   TRIG 69.0 11/08/2018   HDL 69.70 11/08/2018   LDLCALC 53 11/08/2018   ALT 16 11/08/2018   AST 19 11/08/2018   NA 140 11/08/2018   K 4.3 11/08/2018   CL 105 11/08/2018   CREATININE 1.12 11/08/2018   BUN 21 11/08/2018   CO2 25 11/08/2018   TSH 0.98 11/08/2018   PSA 1.57 11/08/2018       Assessment & Plan:

## 2019-07-23 NOTE — Assessment & Plan Note (Signed)
Ok to change labetolol to toprol xl 50 qd, f/u bp at home and next visit

## 2019-07-23 NOTE — Assessment & Plan Note (Signed)
C/w djd vs tendonitis, cont alleve prn, declines nsaid, f/u sport med

## 2019-07-23 NOTE — Patient Instructions (Addendum)
Ok to stop the labetolol after your evening dose tonight  Tomorrow - Please take all new medication as prescribed - the metoprolol XL 50 mg per day  In one week, please start checking the BP and the Heart Rate as you have been doing, and let us know after several days the average BP and HR, to consider increased metoprolol ER  Please continue all other medications as before, and refills have been done if requested.  Please have the pharmacy call with any other refills you may need.  Please continue your efforts at being more active, low cholesterol diet, and weight control.  Please keep your appointments with your specialists as you may have planned  Please go to the XRAY Department in the first floor for the x-ray testing  You will be contacted by phone if any changes need to be made immediately.  Otherwise, you will receive a letter about your results with an explanation, but please check with MyChart first.  Please remember to sign up for MyChart if you have not done so, as this will be important to you in the future with finding out test results, communicating by private email, and scheduling acute appointments online when needed.  Your Physical is actually already scheduled for November 18, 2019 - Please return with Lab Appointment for testing done 3-5 days before at the [facility] (so this is for TWO appointments - please see the scheduling desk as you leave)

## 2019-07-23 NOTE — Assessment & Plan Note (Signed)
stable overall by history and exam, recent data reviewed with pt, and pt to continue medical treatment as before,  to f/u any worsening symptoms or concerns  

## 2019-08-16 ENCOUNTER — Encounter: Payer: Self-pay | Admitting: Internal Medicine

## 2019-08-18 ENCOUNTER — Other Ambulatory Visit: Payer: Self-pay | Admitting: Internal Medicine

## 2019-11-12 ENCOUNTER — Other Ambulatory Visit: Payer: Medicare Other

## 2019-11-18 ENCOUNTER — Ambulatory Visit: Payer: Medicare Other

## 2019-11-18 ENCOUNTER — Encounter: Payer: Medicare Other | Admitting: Internal Medicine

## 2019-11-26 ENCOUNTER — Ambulatory Visit (INDEPENDENT_AMBULATORY_CARE_PROVIDER_SITE_OTHER): Payer: Medicare Other

## 2019-11-26 ENCOUNTER — Other Ambulatory Visit: Payer: Self-pay

## 2019-11-26 ENCOUNTER — Other Ambulatory Visit (INDEPENDENT_AMBULATORY_CARE_PROVIDER_SITE_OTHER): Payer: Medicare Other

## 2019-11-26 VITALS — BP 118/80 | HR 67 | Temp 98.3°F | Resp 16 | Ht 67.0 in | Wt 194.0 lb

## 2019-11-26 DIAGNOSIS — Z0000 Encounter for general adult medical examination without abnormal findings: Secondary | ICD-10-CM

## 2019-11-26 LAB — URINALYSIS, ROUTINE W REFLEX MICROSCOPIC
Bilirubin Urine: NEGATIVE
Hgb urine dipstick: NEGATIVE
Ketones, ur: NEGATIVE
Leukocytes,Ua: NEGATIVE
Nitrite: NEGATIVE
RBC / HPF: NONE SEEN (ref 0–?)
Specific Gravity, Urine: 1.015 (ref 1.000–1.030)
Total Protein, Urine: NEGATIVE
Urine Glucose: NEGATIVE
Urobilinogen, UA: 0.2 (ref 0.0–1.0)
pH: 6 (ref 5.0–8.0)

## 2019-11-26 LAB — HEPATIC FUNCTION PANEL
ALT: 18 U/L (ref 0–53)
AST: 20 U/L (ref 0–37)
Albumin: 4.3 g/dL (ref 3.5–5.2)
Alkaline Phosphatase: 47 U/L (ref 39–117)
Bilirubin, Direct: 0.2 mg/dL (ref 0.0–0.3)
Total Bilirubin: 0.7 mg/dL (ref 0.2–1.2)
Total Protein: 6.8 g/dL (ref 6.0–8.3)

## 2019-11-26 LAB — CBC WITH DIFFERENTIAL/PLATELET
Basophils Absolute: 0.1 10*3/uL (ref 0.0–0.1)
Basophils Relative: 0.8 % (ref 0.0–3.0)
Eosinophils Absolute: 0.1 10*3/uL (ref 0.0–0.7)
Eosinophils Relative: 1.9 % (ref 0.0–5.0)
HCT: 42.8 % (ref 39.0–52.0)
Hemoglobin: 14.5 g/dL (ref 13.0–17.0)
Lymphocytes Relative: 18.7 % (ref 12.0–46.0)
Lymphs Abs: 1.3 10*3/uL (ref 0.7–4.0)
MCHC: 34 g/dL (ref 30.0–36.0)
MCV: 89.7 fl (ref 78.0–100.0)
Monocytes Absolute: 0.6 10*3/uL (ref 0.1–1.0)
Monocytes Relative: 7.9 % (ref 3.0–12.0)
Neutro Abs: 5 10*3/uL (ref 1.4–7.7)
Neutrophils Relative %: 70.7 % (ref 43.0–77.0)
Platelets: 261 10*3/uL (ref 150.0–400.0)
RBC: 4.77 Mil/uL (ref 4.22–5.81)
RDW: 13.5 % (ref 11.5–15.5)
WBC: 7 10*3/uL (ref 4.0–10.5)

## 2019-11-26 LAB — BASIC METABOLIC PANEL
BUN: 25 mg/dL — ABNORMAL HIGH (ref 6–23)
CO2: 27 mEq/L (ref 19–32)
Calcium: 9.9 mg/dL (ref 8.4–10.5)
Chloride: 104 mEq/L (ref 96–112)
Creatinine, Ser: 1.31 mg/dL (ref 0.40–1.50)
GFR: 52.64 mL/min — ABNORMAL LOW (ref >60.00)
Glucose, Bld: 99 mg/dL (ref 70–99)
Potassium: 4.3 mEq/L (ref 3.5–5.1)
Sodium: 137 mEq/L (ref 135–145)

## 2019-11-26 LAB — LIPID PANEL
Cholesterol: 127 mg/dL (ref 0–200)
HDL: 53.9 mg/dL (ref >39.00)
LDL Cholesterol: 55 mg/dL (ref 0–99)
NonHDL: 73.38
Total CHOL/HDL Ratio: 2
Triglycerides: 91 mg/dL (ref 0.0–149.0)
VLDL: 18.2 mg/dL (ref 0.0–40.0)

## 2019-11-26 LAB — PSA: PSA: 2.47 ng/mL (ref 0.10–4.00)

## 2019-11-26 LAB — TSH: TSH: 1.19 u[IU]/mL (ref 0.35–4.50)

## 2019-11-26 NOTE — Progress Notes (Signed)
Subjective:   Brandon [name] is a 79 y.o. male who presents for Medicare Annual/Subsequent preventive examination.  Review of Systems    No ROS. Medicare Wellness Visit Cardiac Risk Factors include: advanced age (>53men, >26 women);dyslipidemia;family history of premature cardiovascular disease;hypertension;male gender;obesity (BMI >30kg/m2)     Objective:    Today's Vitals   11/26/19 1029  BP: 118/80  Pulse: 67  Resp: 16  Temp: 98.3 F (36.8 C)  SpO2: 96%  Weight: 194 lb (88 kg)  Height: 5\' 7"  (1.702 m)  PainSc: 0-No pain   Body mass index is 30.38 kg/m.  Advanced Directives 11/26/2019 11/13/2018 06/30/2016 04/04/2011  Does Patient Have a Medical Advance Directive? Yes Yes No;Yes Patient does not have advance directive  Type of Scientist, forensic Power of [place];Living will [place];Living will - -  Does patient want to make changes to medical advance directive? No - Patient declined - - -  Copy of [place] in Chart? No - copy requested No - copy requested - -    Current Medications (verified) Outpatient Encounter Medications as of 11/26/2019  Medication Sig  . Acetaminophen (TYLENOL ARTHRITIS PAIN PO) Take by mouth daily.  Marland Kitchen amLODipine (NORVASC) 5 MG tablet Take 1 tablet (5 mg total) by mouth daily. Annual appt due in July must see provider for future refills  . calcium & magnesium carbonates (MYLANTA) 311-232 MG per tablet Take 1 tablet by mouth daily.  . cholecalciferol (VITAMIN D) 1000 units tablet Take 5,000 Units by mouth daily.   . finasteride (PROSCAR) 5 MG tablet Take 5 mg by mouth daily.    . furosemide (LASIX) 20 MG tablet Take 1 tablet (20 mg total) by mouth daily as needed for edema.  Marland Kitchen losartan (COZAAR) 100 MG tablet Take 1 tablet (100 mg total) by mouth daily.  . metoprolol succinate (TOPROL-XL) 50 MG 24 hr tablet Take 1 tablet (50 mg total) by mouth daily. Take with or immediately following a meal.  .  Multiple Vitamin (MULTIVITAMIN) tablet Take 1 tablet by mouth daily.    Marland Kitchen omeprazole (PRILOSEC) 20 MG capsule TAKE 1 CAPSULE BY MOUTH ONCE DAILY AS NEEDED  . Pyridoxine HCl (VITAMIN B-6) 500 MG tablet Take 500 mg by mouth daily.  . rosuvastatin (CRESTOR) 20 MG tablet Take 1 tablet by mouth once daily  . vitamin C (ASCORBIC ACID) 500 MG tablet Take 500 mg by mouth daily.  . Omega-3 Fatty Acids (FISH OIL) 1000 MG CAPS Take by mouth.  . oxyCODONE (OXY IR/ROXICODONE) 5 MG immediate release tablet oxycodone 5 mg tablet   No facility-administered encounter medications on file as of 11/26/2019.    Allergies (verified) Patient has no known allergies.   History: Past Medical History:  Diagnosis Date  . 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 BENIGN PROSTATIC HYPERTROPHY 11/15/2006  . 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 COLONIC POLYPS, HX OF 11/15/2006  . ERECTILE DYSFUNCTION 11/15/2006  . GERD 11/15/2006  . HYPERLIPIDEMIA 11/15/2006  . HYPERTENSION 11/15/2006   borderline  . Lipoma 11/29/2010  . LOW BACK PAIN 11/14/2007   recurrent  . PARESTHESIA, HANDS 11/23/2008  . PERIPHERAL VASCULAR DISEASE 11/25/2009   right carotid 60-79% July 2011  . PSA, INCREASED 11/14/2007  . 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 COLONOSCOPY    . INGUINAL HERNIA REPAIR  2001  . MASS EXCISION  04/06/2011   Procedure: EXCISION MASS;  Surgeon: Judieth Keens, DO;  Location: [facility];  Service: General;  Laterality: Right;  excision right  upper back mass  . PROSTATE BIOPSY  Fall 2009   S/P-neg-Dr. Jeffie Pollock   Family History  Problem Relation Age of Onset  . Cancer Mother        Lung Cancer  . Depression Mother   . Heart disease Father   . Stroke Father   . Cancer Other        Colon Cancer-Uncle   Social History   Socioeconomic History  . Marital status: Married     Spouse name: Not on file  . Number of children: 3  . Years of education: Not on file  . Highest education level: Not on file  Occupational History  . Occupation: Retired    Comment: Retired  Immunologist  . Smoking status: Former Smoker    Quit date: 02/09/1960    Years since quitting: 59.8  . Smokeless tobacco: Never Used  Vaping Use  . Vaping Use: Never used  Substance and Sexual Activity  . Alcohol use: Yes  . Drug use: No  . Sexual activity: Not Currently  Other Topics Concern  . Not on file  Social History Narrative   Retired Market researcher   Social Determinants of Health   Financial Resource Strain: [place]   . Difficulty of Paying Living Expenses: Not hard at all  Food Insecurity: No Food Insecurity  . Worried About Charity fundraiser in the Last Year: Never true  . Ran Out of Food in the Last Year: Never true  Transportation Needs: No Transportation Needs  . Lack of Transportation (Medical): No  . Lack of Transportation (Non-Medical): No  Physical Activity: Sufficiently Active  . Days of Exercise per Week: 7 days  . Minutes of Exercise per Session: 30 min  Stress: No Stress Concern Present  . Feeling of Stress : Not at all  Social Connections: Socially Integrated  . Frequency of Communication with Friends and Family: More than three times a week  . Frequency of Social Gatherings with Friends and Family: Once a week  . Attends Religious Services: More than 4 times per year  . Active Member of Clubs or Organizations: Yes  . Attends Archivist Meetings: More than 4 times per year  . Marital Status: Married    Tobacco Counseling Counseling given: No   Clinical Intake:  Pre-visit preparation completed: Yes  Pain : No/denies pain Pain Score: 0-No pain     BMI - recorded: 30.38 Nutritional Status: BMI > 30  Obese Nutritional Risks: None Diabetes: No  How often do you need to have someone help you when you read  instructions, pamphlets, or other written materials from your doctor or pharmacy?: 1 - Never What is the last grade level you completed in school?: HSG  Diabetic? no  Interpreter Needed?: No  Information entered by :: Lisette Abu, LPN   Activities of Daily Living In your present state of health, do you have any difficulty performing the following activities: 11/26/2019  Hearing? N  Vision? N  Difficulty concentrating or making decisions? N  Walking or climbing stairs? N  Dressing or bathing? N  Doing errands, shopping? N  Preparing Food and eating ? N  Using the Toilet? N  In the past six months, have you accidently leaked urine? N  Do you have problems with loss of bowel control?  N  Managing your Medications? N  Managing your Finances? N  Housekeeping or managing your Housekeeping? N  Some recent data might be hidden    Patient Care Team: [name], MD as PCP - General Myrlene Broker, MD as Attending Physician (Urology) Roseanne Kaufman, MD as Consulting Physician (Orthopedic Surgery) Susa Day, MD as Consulting Physician (Orthopedic Surgery)  Indicate any recent Medical Services you may have received from other than [facility] providers in the past year (date may be approximate).     Assessment:   This is a routine wellness examination for [facility].  Hearing/Vision screen No exam data present  Dietary issues and exercise activities discussed: Current Exercise Habits: Home exercise routine, Type of exercise: walking, Time (Minutes): 30, Frequency (Times/Week): 7, Weekly Exercise (Minutes/Week): 210, Intensity: Moderate, Exercise limited by: None identified  Goals    . Patient Stated     Increase my physical activity by going to the gym when it opens. Eat healthy and enjoy life.    . Weight (lb) < 200 lb (90.7 kg)     Loose weight      Depression Screen PHQ 2/9 Scores 11/26/2019 06/02/2019 11/13/2018 11/13/2018 07/23/2017 06/30/2016 06/25/2015  PHQ - 2  Score 0 0 0 0 0 0 0  PHQ- 9 Score - - - 0 0 - -    Fall Risk Fall Risk  11/26/2019 06/02/2019 11/13/2018 07/23/2017 06/30/2016  Falls in the past year? 0 0 0 No No  Number falls in past yr: 0 - - - -  Injury with Fall? 0 - - - -  Risk for fall due to : No Fall Risks - - - -  Follow up Falls evaluation completed - - - -    Any stairs in or around the home? No  If so, are there any without handrails? No  Home free of loose throw rugs in walkways, pet beds, electrical cords, etc? Yes  Adequate lighting in your home to reduce risk of falls? Yes   ASSISTIVE DEVICES UTILIZED TO PREVENT FALLS:  Life alert? Yes  Use of a cane, walker or w/c? No  Grab bars in the bathroom? Yes  Shower chair or bench in shower? Yes  Elevated toilet seat or a handicapped toilet? Yes   TIMED UP AND GO:  Was the test performed? No .  Length of time to ambulate 10 feet: 0 sec.   Gait steady and fast without use of assistive device  Cognitive Function:     6CIT Screen 11/26/2019  What Year? 0 points  What month? 0 points  What time? 0 points  Count back from 20 0 points  Months in reverse 0 points  Repeat phrase 0 points  Total Score 0    Immunizations Immunization History  Administered Date(s) Administered  . Influenza-Unspecified 03/02/2015, 03/02/2017, 01/14/2018  . Pneumococcal Conjugate-13 12/16/2013  . Pneumococcal Polysaccharide-23 05/01/2004, 11/29/2010  . Td 11/23/2008  . Tdap 11/13/2018  . Zoster 04/20/2009    TDAP status: Up to date Flu Vaccine status: Up to date Pneumococcal vaccine status: Up to date Covid-19 vaccine status: Completed vaccines  Qualifies for Shingles Vaccine? Yes   Zostavax completed Yes   Shingrix Completed?: No.    Education has been provided regarding the importance of this vaccine. Patient has been advised to call insurance company to determine out of pocket expense if they have not yet received this vaccine. Advised may also receive vaccine at local pharmacy  or Health Dept. Verbalized acceptance  and understanding.  Screening Tests Health Maintenance  Topic Date Due  . COVID-19 Vaccine (1) Never done  . INFLUENZA VACCINE  11/30/2019  . TETANUS/TDAP  11/12/2028  . PNA vac Low Risk Adult  Completed    Health Maintenance  Health Maintenance Due  Topic Date Due  . COVID-19 Vaccine (1) Never done    Colorectal cancer screening: No longer required.   Lung Cancer Screening: (Low Dose CT Chest recommended if Age 40-80 years, 30 pack-year currently smoking OR have quit w/in 15years.) does not qualify.   Lung Cancer Screening Referral: no  Additional Screening:  Hepatitis C Screening: does not qualify; Completed no  Vision Screening: Recommended annual ophthalmology exams for early detection of glaucoma and other disorders of the eye. Is the patient up to date with their annual eye exam?  Yes  Who is the provider or what is the name of the office in which the patient attends annual eye exams? [facility] If pt is not established with a provider, would they like to be referred to a provider to establish care? No .   Dental Screening: Recommended annual dental exams for proper oral hygiene  Community Resource Referral / Chronic Care Management: CRR required this visit?  No   CCM required this visit?  No      Plan:     I have personally reviewed and noted the following in the patient's chart:   . Medical and social history . Use of alcohol, tobacco or illicit drugs  . Current medications and supplements . Functional ability and status . Nutritional status . Physical activity . Advanced directives . List of other physicians . Hospitalizations, surgeries, and ER visits in previous 12 months . Vitals . Screenings to include cognitive, depression, and falls . Referrals and appointments  In addition, I have reviewed and discussed with patient certain preventive protocols, quality metrics, and best practice recommendations. A  written personalized care plan for preventive services as well as general preventive health recommendations were provided to patient.     Sheral Flow, LPN   [id number]   Nurse Notes: n/a

## 2019-11-26 NOTE — Patient Instructions (Signed)
Brandon [name] , Thank you for taking time to come for your Medicare Wellness Visit. I appreciate your ongoing commitment to your health goals. Please review the following plan we discussed and let me know if I can assist you in the future.   Screening recommendations/referrals: Colonoscopy: no repeat due to age Recommended yearly ophthalmology/optometry visit for glaucoma screening and checkup Recommended yearly dental visit for hygiene and checkup  Vaccinations: Influenza vaccine: up to date Pneumococcal vaccine: up to date Tdap vaccine: up to date Shingles vaccine: will talk with pharmacy/PCP   Covid-19: completed  Advanced directives: Please bring a copy of your health care power of attorney and living will to the office at your convenience.  Conditions/risks identified: Yes; Please continue to do your personal lifestyle choices by: daily care of teeth and gums, regular physical activity (goal should be 5 days a week for 30 minutes), eat a healthy diet, avoid tobacco and drug use, limiting any alcohol intake, taking a low-dose aspirin (if not allergic or have been advised by your provider otherwise) and taking vitamins and minerals as recommended by your provider. Continue doing brain stimulating activities (puzzles, reading, adult coloring books, staying active) to keep memory sharp. Continue to eat heart healthy diet (full of fruits, vegetables, whole grains, lean protein, water--limit salt, fat, and sugar intake) and increase physical activity as tolerated.  Next appointment: In 1 year  Preventive Care 39 Years and Older, Male Preventive care refers to lifestyle choices and visits with your health care provider that can promote health and wellness. What does preventive care include?  A yearly physical exam. This is also called an annual well check.  Dental exams once or twice a year.  Routine eye exams. Ask your health care provider how often you should have your eyes  checked.  Personal lifestyle choices, including:  Daily care of your teeth and gums.  Regular physical activity.  Eating a healthy diet.  Avoiding tobacco and drug use.  Limiting alcohol use.  Practicing safe sex.  Taking low doses of aspirin every day.  Taking vitamin and mineral supplements as recommended by your health care provider. What happens during an annual well check? The services and screenings done by your health care provider during your annual well check will depend on your age, overall health, lifestyle risk factors, and family history of disease. Counseling  Your health care provider may ask you questions about your:  Alcohol use.  Tobacco use.  Drug use.  Emotional well-being.  Home and relationship well-being.  Sexual activity.  Eating habits.  History of falls.  Memory and ability to understand (cognition).  Work and work Statistician. Screening  You may have the following tests or measurements:  Height, weight, and BMI.  Blood pressure.  Lipid and cholesterol levels. These may be checked every 5 years, or more frequently if you are over 32 years old.  Skin check.  Lung cancer screening. You may have this screening every year starting at age 75 if you have a 30-pack-year history of smoking and currently smoke or have quit within the past 15 years.  Fecal occult blood test (FOBT) of the stool. You may have this test every year starting at age 46.  Flexible sigmoidoscopy or colonoscopy. You may have a sigmoidoscopy every 5 years or a colonoscopy every 10 years starting at age 64.  Prostate cancer screening. Recommendations will vary depending on your family history and other risks.  Hepatitis C blood test.  Hepatitis B blood test.  Sexually transmitted disease (STD) testing.  Diabetes screening. This is done by checking your blood sugar (glucose) after you have not eaten for a while (fasting). You may have this done every 1-3  years.  Abdominal aortic aneurysm (AAA) screening. You may need this if you are a current or former smoker.  Osteoporosis. You may be screened starting at age 13 if you are at high risk. Talk with your health care provider about your test results, treatment options, and if necessary, the need for more tests. Vaccines  Your health care provider may recommend certain vaccines, such as:  Influenza vaccine. This is recommended every year.  Tetanus, diphtheria, and acellular pertussis (Tdap, Td) vaccine. You may need a Td booster every 10 years.  Zoster vaccine. You may need this after age 59.  Pneumococcal 13-valent conjugate (PCV13) vaccine. One dose is recommended after age 83.  Pneumococcal polysaccharide (PPSV23) vaccine. One dose is recommended after age 70. Talk to your health care provider about which screenings and vaccines you need and how often you need them. This information is not intended to replace advice given to you by your health care provider. Make sure you discuss any questions you have with your health care provider. Document Released: 05/14/2015 Document Revised: 01/05/2016 Document Reviewed: 02/16/2015 Elsevier Interactive Patient Education  2017 [employer] Prevention in the Home Falls can cause injuries. They can happen to people of all ages. There are many things you can do to make your home safe and to help prevent falls. What can I do on the outside of my home?  Regularly fix the edges of walkways and driveways and fix any cracks.  Remove anything that might make you trip as you walk through a door, such as a raised step or threshold.  Trim any bushes or trees on the path to your home.  Use bright outdoor lighting.  Clear any walking paths of anything that might make someone trip, such as rocks or tools.  Regularly check to see if handrails are loose or broken. Make sure that both sides of any steps have handrails.  Any raised decks and porches  should have guardrails on the edges.  Have any leaves, snow, or ice cleared regularly.  Use sand or salt on walking paths during winter.  Clean up any spills in your garage right away. This includes oil or grease spills. What can I do in the bathroom?  Use night lights.  Install grab bars by the toilet and in the tub and shower. Do not use towel bars as grab bars.  Use non-skid mats or decals in the tub or shower.  If you need to sit down in the shower, use a plastic, non-slip stool.  Keep the floor dry. Clean up any water that spills on the floor as soon as it happens.  Remove soap buildup in the tub or shower regularly.  Attach bath mats securely with double-sided non-slip rug tape.  Do not have throw rugs and other things on the floor that can make you trip. What can I do in the bedroom?  Use night lights.  Make sure that you have a light by your bed that is easy to reach.  Do not use any sheets or blankets that are too big for your bed. They should not hang down onto the floor.  Have a firm chair that has side arms. You can use this for support while you get dressed.  Do not have throw rugs and other  things on the floor that can make you trip. What can I do in the kitchen?  Clean up any spills right away.  Avoid walking on wet floors.  Keep items that you use a lot in easy-to-reach places.  If you need to reach something above you, use a strong step stool that has a grab bar.  Keep electrical cords out of the way.  Do not use floor polish or wax that makes floors slippery. If you must use wax, use non-skid floor wax.  Do not have throw rugs and other things on the floor that can make you trip. What can I do with my stairs?  Do not leave any items on the stairs.  Make sure that there are handrails on both sides of the stairs and use them. Fix handrails that are broken or loose. Make sure that handrails are as long as the stairways.  Check any carpeting to  make sure that it is firmly attached to the stairs. Fix any carpet that is loose or worn.  Avoid having throw rugs at the top or bottom of the stairs. If you do have throw rugs, attach them to the floor with carpet tape.  Make sure that you have a light switch at the top of the stairs and the bottom of the stairs. If you do not have them, ask someone to add them for you. What else can I do to help prevent falls?  Wear shoes that:  Do not have high heels.  Have rubber bottoms.  Are comfortable and fit you well.  Are closed at the toe. Do not wear sandals.  If you use a stepladder:  Make sure that it is fully opened. Do not climb a closed stepladder.  Make sure that both sides of the stepladder are locked into place.  Ask someone to hold it for you, if possible.  Clearly mark and make sure that you can see:  Any grab bars or handrails.  First and last steps.  Where the edge of each step is.  Use tools that help you move around (mobility aids) if they are needed. These include:  Canes.  Walkers.  Scooters.  Crutches.  Turn on the lights when you go into a dark area. Replace any light bulbs as soon as they burn out.  Set up your furniture so you have a clear path. Avoid moving your furniture around.  If any of your floors are uneven, fix them.  If there are any pets around you, be aware of where they are.  Review your medicines with your doctor. Some medicines can make you feel dizzy. This can increase your chance of falling. Ask your doctor what other things that you can do to help prevent falls. This information is not intended to replace advice given to you by your health care provider. Make sure you discuss any questions you have with your health care provider. Document Released: 02/11/2009 Document Revised: 09/23/2015 Document Reviewed: 05/22/2014 Elsevier Interactive Patient Education  2017 [employer].

## 2019-11-28 ENCOUNTER — Ambulatory Visit (INDEPENDENT_AMBULATORY_CARE_PROVIDER_SITE_OTHER): Payer: Medicare Other | Admitting: Internal Medicine

## 2019-11-28 ENCOUNTER — Encounter: Payer: Self-pay | Admitting: Internal Medicine

## 2019-11-28 ENCOUNTER — Other Ambulatory Visit: Payer: Self-pay

## 2019-11-28 VITALS — BP 118/70 | HR 56 | Temp 98.6°F | Ht 67.0 in | Wt 195.0 lb

## 2019-11-28 DIAGNOSIS — I10 Essential (primary) hypertension: Secondary | ICD-10-CM | POA: Diagnosis not present

## 2019-11-28 DIAGNOSIS — F419 Anxiety disorder, unspecified: Secondary | ICD-10-CM | POA: Diagnosis not present

## 2019-11-28 DIAGNOSIS — K5900 Constipation, unspecified: Secondary | ICD-10-CM

## 2019-11-28 DIAGNOSIS — G4710 Hypersomnia, unspecified: Secondary | ICD-10-CM | POA: Diagnosis not present

## 2019-11-28 DIAGNOSIS — Z0000 Encounter for general adult medical examination without abnormal findings: Secondary | ICD-10-CM | POA: Diagnosis not present

## 2019-11-28 DIAGNOSIS — Z0001 Encounter for general adult medical examination with abnormal findings: Secondary | ICD-10-CM

## 2019-11-28 DIAGNOSIS — R609 Edema, unspecified: Secondary | ICD-10-CM

## 2019-11-28 NOTE — Progress Notes (Signed)
Subjective:    Patient ID: Brandon [name], male    DOB: Apr 14, 1941, 79 y.o.   MRN: [id number]  HPI  Here for wellness and f/u;  Overall doing ok;  Pt denies Chest pain, worsening SOB, DOE, wheezing, orthopnea, PND, worsening LE edema, palpitations, dizziness or syncope.  Pt denies neurological change such as new headache, facial or extremity weakness.  Pt denies polydipsia, polyuria, or low sugar symptoms. Pt states overall good compliance with treatment and medications, good tolerability, and has been trying to follow appropriate diet.  Pt denies worsening depressive symptoms, suicidal ideation or panic. No fever, night sweats, wt loss, loss of appetite, or other constitutional symptoms.  Pt states good ability with ADL's, has low fall risk, home safety reviewed and adequate, no other significant changes in hearing or vision, and only occasionally active with exercise. Wt Readings from Last 3 Encounters:  11/28/19 195 lb (88.5 kg)  11/26/19 194 lb (88 kg)  07/23/19 193 lb 9.6 oz (87.8 kg)  Also c/o worsening snoring recently, maybe some daytime somnolence, asks to see pulmonary for r/o osa.  Denies worsening reflux, abd pain, dysphagia, n/v, bowel change or blood except for worsening constipation in last week, only using the miralax every few days, not sure if he can take every day Past Medical History:  Diagnosis Date  . 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 BENIGN PROSTATIC HYPERTROPHY 11/15/2006  . 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 COLONIC POLYPS, HX OF 11/15/2006  . ERECTILE DYSFUNCTION 11/15/2006  . GERD 11/15/2006  . HYPERLIPIDEMIA 11/15/2006  . HYPERTENSION 11/15/2006   borderline  . Lipoma 11/29/2010  . LOW BACK PAIN 11/14/2007   recurrent  . PARESTHESIA, HANDS 11/23/2008  . PERIPHERAL VASCULAR DISEASE 11/25/2009   right carotid 60-79% July 2011  . PSA, INCREASED 11/14/2007  . Sciatica 11/15/2006   with known lumbardegenerative disk disease and degenerative joint  disease   Past Surgical History:  Procedure Laterality Date  . COLONOSCOPY    . INGUINAL HERNIA REPAIR  2001  . MASS EXCISION  04/06/2011   Procedure: EXCISION MASS;  Surgeon: Judieth Keens, DO;  Location: [place];  Service: General;  Laterality: Right;  excision right  upper back mass  . PROSTATE BIOPSY  Fall 2009   S/P-neg-Dr. Jeffie Pollock    reports that he quit smoking about 59 years ago. He has never used smokeless tobacco. He reports current alcohol use. He reports that he does not use drugs. family history includes Cancer in his mother and another family member; Depression in his mother; Heart disease in his father; Stroke in his father. No Known Allergies Current Outpatient Medications on File Prior to Visit  Medication Sig Dispense Refill  . Acetaminophen (TYLENOL ARTHRITIS PAIN PO) Take by mouth daily.    Marland Kitchen amLODipine (NORVASC) 5 MG tablet Take 1 tablet (5 mg total) by mouth daily. Annual appt due in July must see provider for future refills 90 tablet 0  . calcium & magnesium carbonates (MYLANTA) 311-232 MG per tablet Take 1 tablet by mouth daily.    . cholecalciferol (VITAMIN D) 1000 units tablet Take 5,000 Units by mouth daily.     . finasteride (PROSCAR) 5 MG tablet Take 5 mg by mouth daily.      . furosemide (LASIX) 20 MG tablet Take 1 tablet (20 mg total) by mouth daily as needed for edema. 30 tablet 11  . labetalol (NORMODYNE) 200 MG tablet Take 200 mg by mouth 2 (two) times  daily.    . losartan (COZAAR) 100 MG tablet Take 1 tablet (100 mg total) by mouth daily. 90 tablet 3  . Multiple Vitamin (MULTIVITAMIN) tablet Take 1 tablet by mouth daily.      Marland Kitchen omeprazole (PRILOSEC) 20 MG capsule TAKE 1 CAPSULE BY MOUTH ONCE DAILY AS NEEDED 90 capsule 0  . Pyridoxine HCl (VITAMIN B-6) 500 MG tablet Take 500 mg by mouth daily.    . rosuvastatin (CRESTOR) 20 MG tablet Take 1 tablet by mouth once daily 90 tablet 2  . vitamin C (ASCORBIC ACID) 500 MG tablet Take 500 mg by  mouth daily.     No current facility-administered medications on file prior to visit.   Review of Systems All otherwise neg per pt     Objective:   Physical Exam BP 118/70 (BP Location: Left Arm, Patient Position: Sitting, Cuff Size: Large)   Pulse 56   Temp 98.6 F (37 C) (Oral)   Ht 5\' 7"  (1.702 m)   Wt 195 lb (88.5 kg)   SpO2 98%   BMI 30.54 kg/m  VS noted,  Constitutional: Pt appears in NAD HENT: Head: NCAT.  Right Ear: External ear normal.  Left Ear: External ear normal.  Eyes: . Pupils are equal, round, and reactive to light. Conjunctivae and EOM are normal Nose: without d/c or deformity Neck: Neck supple. Gross normal ROM Cardiovascular: Normal rate and regular rhythm.   Pulmonary/Chest: Effort normal and breath sounds without rales or wheezing.  Abd:  Soft, NT, ND, + BS, no organomegaly Neurological: Pt is alert. At baseline orientation, motor grossly intact Skin: Skin is warm. No rashes, other new lesions, has bilat trace ankle LE edema Psychiatric: Pt behavior is normal without agitation  All otherwise neg per pt Lab Results  Component Value Date   WBC 7.0 11/26/2019   HGB 14.5 11/26/2019   HCT 42.8 11/26/2019   PLT 261.0 11/26/2019   GLUCOSE 99 11/26/2019   CHOL 127 11/26/2019   TRIG 91.0 11/26/2019   HDL 53.90 11/26/2019   LDLCALC 55 11/26/2019   ALT 18 11/26/2019   AST 20 11/26/2019   NA 137 11/26/2019   K 4.3 11/26/2019   CL 104 11/26/2019   CREATININE 1.31 11/26/2019   BUN 25 (H) 11/26/2019   CO2 27 11/26/2019   TSH 1.19 11/26/2019   PSA 2.47 11/26/2019        Assessment & Plan:

## 2019-11-28 NOTE — Patient Instructions (Addendum)
Please continue all other medications as before, and refills have been done if requested.  Please have the pharmacy call with any other refills you may need.  Please continue your efforts at being more active, low cholesterol diet, and weight control.  You are otherwise up to date with prevention measures today.  Please keep your appointments with your specialists as you may have planned  Ok to take the miralax every day  You will be contacted regarding the referral for: pulmonary  Please make an Appointment to return in 6 months, or sooner if needed

## 2019-11-30 ENCOUNTER — Encounter: Payer: Self-pay | Admitting: Internal Medicine

## 2019-11-30 DIAGNOSIS — G4710 Hypersomnia, unspecified: Secondary | ICD-10-CM | POA: Insufficient documentation

## 2019-11-30 NOTE — Assessment & Plan Note (Signed)
Chronic stable venous insufficiency, ok to follow, cont lasix prn

## 2019-11-30 NOTE — Assessment & Plan Note (Addendum)
Ok for refer pulmonary, - r/o osa  I spent 31 minutes in addition to time for CPX wellness examination in preparing to see the patient by review of recent labs, imaging and procedures, obtaining and reviewing separately obtained history, communicating with the patient and family or caregiver, ordering medications, tests or procedures, and documenting clinical information in the EHR including the differential Dx, treatment, and any further evaluation and other management of hypersomnolence, htn, anxiety, edema, constipatoin

## 2019-11-30 NOTE — Assessment & Plan Note (Signed)
stable overall by history and exam, recent data reviewed with pt, and pt to continue medical treatment as before,  to f/u any worsening symptoms or concerns  

## 2019-11-30 NOTE — Assessment & Plan Note (Signed)
Mild chronic persistent, cont same tx

## 2019-11-30 NOTE — Assessment & Plan Note (Signed)
Encouraged pt to take miralax every day

## 2019-11-30 NOTE — Assessment & Plan Note (Signed)

## 2019-12-01 ENCOUNTER — Encounter: Payer: Self-pay | Admitting: Internal Medicine

## 2019-12-01 DIAGNOSIS — N289 Disorder of kidney and ureter, unspecified: Secondary | ICD-10-CM

## 2019-12-17 DIAGNOSIS — Z4789 Encounter for other orthopedic aftercare: Secondary | ICD-10-CM | POA: Diagnosis not present

## 2019-12-17 DIAGNOSIS — G5601 Carpal tunnel syndrome, right upper limb: Secondary | ICD-10-CM | POA: Diagnosis not present

## 2020-01-06 ENCOUNTER — Encounter: Payer: Self-pay | Admitting: Internal Medicine

## 2020-01-14 DIAGNOSIS — G5601 Carpal tunnel syndrome, right upper limb: Secondary | ICD-10-CM | POA: Diagnosis not present

## 2020-01-15 ENCOUNTER — Other Ambulatory Visit: Payer: Self-pay | Admitting: Internal Medicine

## 2020-01-15 MED ORDER — OMEPRAZOLE 20 MG PO CPDR: 20.0000 mg | DELAYED_RELEASE_CAPSULE | Freq: Every day | ORAL | 3 refills | Status: DC

## 2020-01-26 ENCOUNTER — Encounter: Payer: Self-pay | Admitting: Internal Medicine

## 2020-01-28 DIAGNOSIS — M25641 Stiffness of right hand, not elsewhere classified: Secondary | ICD-10-CM | POA: Diagnosis not present

## 2020-02-04 DIAGNOSIS — H52223 Regular astigmatism, bilateral: Secondary | ICD-10-CM | POA: Diagnosis not present

## 2020-02-04 DIAGNOSIS — H2513 Age-related nuclear cataract, bilateral: Secondary | ICD-10-CM | POA: Diagnosis not present

## 2020-02-04 DIAGNOSIS — H5203 Hypermetropia, bilateral: Secondary | ICD-10-CM | POA: Diagnosis not present

## 2020-02-04 DIAGNOSIS — H524 Presbyopia: Secondary | ICD-10-CM | POA: Diagnosis not present

## 2020-02-05 ENCOUNTER — Other Ambulatory Visit: Payer: Self-pay | Admitting: Internal Medicine

## 2020-02-05 NOTE — Telephone Encounter (Signed)
Please refill as per office routine med refill policy (all routine meds refilled for 3 mo or monthly per pt preference up to one year from last visit, then month to month grace period for 3 mo, then further med refills will have to be denied)  

## 2020-02-12 ENCOUNTER — Encounter: Payer: Self-pay | Admitting: Internal Medicine

## 2020-02-12 ENCOUNTER — Other Ambulatory Visit: Payer: Self-pay

## 2020-02-12 ENCOUNTER — Ambulatory Visit (INDEPENDENT_AMBULATORY_CARE_PROVIDER_SITE_OTHER): Payer: Medicare Other | Admitting: Internal Medicine

## 2020-02-12 VITALS — BP 130/70 | HR 59 | Temp 98.1°F | Ht 67.0 in | Wt 193.0 lb

## 2020-02-12 DIAGNOSIS — N289 Disorder of kidney and ureter, unspecified: Secondary | ICD-10-CM | POA: Diagnosis not present

## 2020-02-12 DIAGNOSIS — K219 Gastro-esophageal reflux disease without esophagitis: Secondary | ICD-10-CM

## 2020-02-12 DIAGNOSIS — F419 Anxiety disorder, unspecified: Secondary | ICD-10-CM | POA: Diagnosis not present

## 2020-02-12 DIAGNOSIS — I10 Essential (primary) hypertension: Secondary | ICD-10-CM

## 2020-02-12 NOTE — Assessment & Plan Note (Signed)
stable overall by history and exam, recent data reviewed with pt, and pt to continue medical treatment as before,  to f/u any worsening symptoms or concerns  

## 2020-02-12 NOTE — Assessment & Plan Note (Addendum)
Mild, cant r/o ckd vs mild aki, for f/u lab after drink more fluiids  I spent 31 minutes in preparing to see the patient by review of recent labs, imaging and procedures, obtaining and reviewing separately obtained history, communicating with the patient and family or caregiver, ordering medications, tests or procedures, and documenting clinical information in the EHR including the differential Dx, treatment, and any further evaluation and other management of renal insufficiency,gerd, htn, anxiety

## 2020-02-12 NOTE — Progress Notes (Addendum)
Subjective:    Patient ID: Brandon [name], male    DOB: 08/15/1940, 79 y.o.   MRN: [id number]  HPI  Here to f/u; overall doing ok,  Pt denies chest pain, increasing sob or doe, wheezing, orthopnea, PND, increased LE swelling, palpitations, dizziness or syncope.  Pt denies new neurological symptoms such as new headache, or facial or extremity weakness or numbness.  Pt denies polydipsia, polyuria, or low sugar episode.  Pt states overall good compliance with meds, mostly trying to follow appropriate diet, with wt overall stable,  Plans to get the flu shot and later the shingles shot soon.  Denies urinary symptoms such as dysuria, frequency, urgency, flank pain, hematuria or n/v, fever, chills.  Denies worsening reflux, abd pain, dysphagia, n/v, bowel change or blood.  Last labs done while fasting, and had not drank fluids. Past Medical History:  Diagnosis Date  . 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 BENIGN PROSTATIC HYPERTROPHY 11/15/2006  . 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 COLONIC POLYPS, HX OF 11/15/2006  . ERECTILE DYSFUNCTION 11/15/2006  . GERD 11/15/2006  . HYPERLIPIDEMIA 11/15/2006  . HYPERTENSION 11/15/2006   borderline  . Lipoma 11/29/2010  . LOW BACK PAIN 11/14/2007   recurrent  . PARESTHESIA, HANDS 11/23/2008  . PERIPHERAL VASCULAR DISEASE 11/25/2009   right carotid 60-79% July 2011  . PSA, INCREASED 11/14/2007  . 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 COLONOSCOPY    . INGUINAL HERNIA REPAIR  2001  . MASS EXCISION  04/06/2011   Procedure: EXCISION MASS;  Surgeon: Judieth Keens, DO;  Location: [place];  Service: General;  Laterality: Right;  excision right  upper back mass  . PROSTATE BIOPSY  Fall 2009   S/P-neg-Dr. Jeffie Pollock    reports that he quit smoking about 60 years ago. He has never used smokeless tobacco. He reports current  alcohol use. He reports that he does not use drugs. family history includes Cancer in his mother and another family member; Depression in his mother; Heart disease in his father; Stroke in his father. No Known Allergies Current Outpatient Medications on File Prior to Visit  Medication Sig Dispense Refill  . Acetaminophen (TYLENOL ARTHRITIS PAIN PO) Take by mouth daily.    Marland Kitchen amLODipine (NORVASC) 5 MG tablet TAKE 1 TABLET BY MOUTH ONCE DAILY . APPOINTMENT REQUIRED FOR FUTURE REFILLS 90 tablet 0  . calcium & magnesium carbonates (MYLANTA) 311-232 MG per tablet Take 1 tablet by mouth daily.    . cholecalciferol (VITAMIN D) 1000 units tablet Take 5,000 Units by mouth daily.     . finasteride (PROSCAR) 5 MG tablet Take 5 mg by mouth daily.      . furosemide (LASIX) 20 MG tablet Take 1 tablet (20 mg total) by mouth daily as needed for edema. 30 tablet 11  . labetalol (NORMODYNE) 200 MG tablet Take 200 mg by mouth 2 (two) times daily.    Marland Kitchen losartan (COZAAR) 100 MG tablet Take 1 tablet (100 mg total) by mouth daily. 90 tablet 3  . Multiple Vitamin (MULTIVITAMIN) tablet Take 1 tablet by mouth daily.      Marland Kitchen omeprazole (PRILOSEC) 20 MG capsule Take 1 capsule (20 mg total) by mouth daily. TAKE 1 CAPSULE BY MOUTH ONCE DAILY AS NEEDED 90 capsule 3  . Pyridoxine HCl (VITAMIN B-6) 500 MG tablet Take 500 mg by mouth daily.    Marland Kitchen  rosuvastatin (CRESTOR) 20 MG tablet Take 1 tablet by mouth once daily 90 tablet 2  . vitamin C (ASCORBIC ACID) 500 MG tablet Take 500 mg by mouth daily.     No current facility-administered medications on file prior to visit.   Review of Systems All otherwise neg per pt    Objective:   Physical Exam BP 130/70 (BP Location: Left Arm, Patient Position: Sitting, Cuff Size: Large)   Pulse (!) 59   Temp 98.1 F (36.7 C) (Oral)   Ht 5\' 7"  (1.702 m)   Wt 193 lb (87.5 kg)   SpO2 96%   BMI 30.23 kg/m  VS noted,  Constitutional: Pt appears in NAD HENT: Head: NCAT.  Right Ear:  External ear normal.  Left Ear: External ear normal.  Eyes: . Pupils are equal, round, and reactive to light. Conjunctivae and EOM are normal Nose: without d/c or deformity Neck: Neck supple. Gross normal ROM Cardiovascular: Normal rate and regular rhythm.   Pulmonary/Chest: Effort normal and breath sounds without rales or wheezing.  Abd:  Soft, NT, ND, + BS, no organomegaly Neurological: Pt is alert. At baseline orientation, motor grossly intact Skin: Skin is warm. No rashes, other new lesions, no LE edema Psychiatric: Pt behavior is normal without agitation  All otherwise neg per pt Lab Results  Component Value Date   WBC 7.0 11/26/2019   HGB 14.5 11/26/2019   HCT 42.8 11/26/2019   PLT 261.0 11/26/2019   GLUCOSE 99 11/26/2019   CHOL 127 11/26/2019   TRIG 91.0 11/26/2019   HDL 53.90 11/26/2019   LDLCALC 55 11/26/2019   ALT 18 11/26/2019   AST 20 11/26/2019   NA 137 11/26/2019   K 4.3 11/26/2019   CL 104 11/26/2019   CREATININE 1.31 11/26/2019   BUN 25 (H) 11/26/2019   CO2 27 11/26/2019   TSH 1.19 11/26/2019   PSA 2.47 11/26/2019      Assessment & Plan:

## 2020-02-12 NOTE — Patient Instructions (Signed)

## 2020-02-18 ENCOUNTER — Ambulatory Visit: Payer: Medicare Other | Admitting: Pulmonary Disease

## 2020-02-18 ENCOUNTER — Encounter: Payer: Self-pay | Admitting: Pulmonary Disease

## 2020-02-18 ENCOUNTER — Other Ambulatory Visit: Payer: Self-pay

## 2020-02-18 ENCOUNTER — Other Ambulatory Visit (INDEPENDENT_AMBULATORY_CARE_PROVIDER_SITE_OTHER): Payer: Medicare Other

## 2020-02-18 VITALS — BP 120/62 | HR 61 | Temp 97.8°F | Ht 66.5 in | Wt 193.0 lb

## 2020-02-18 DIAGNOSIS — R0989 Other specified symptoms and signs involving the circulatory and respiratory systems: Secondary | ICD-10-CM

## 2020-02-18 DIAGNOSIS — N289 Disorder of kidney and ureter, unspecified: Secondary | ICD-10-CM | POA: Diagnosis not present

## 2020-02-18 DIAGNOSIS — R198 Other specified symptoms and signs involving the digestive system and abdomen: Secondary | ICD-10-CM | POA: Diagnosis not present

## 2020-02-18 DIAGNOSIS — K219 Gastro-esophageal reflux disease without esophagitis: Secondary | ICD-10-CM

## 2020-02-18 DIAGNOSIS — R0683 Snoring: Secondary | ICD-10-CM

## 2020-02-18 LAB — BASIC METABOLIC PANEL
BUN: 25 mg/dL — ABNORMAL HIGH (ref 6–23)
CO2: 25 mEq/L (ref 19–32)
Calcium: 9.4 mg/dL (ref 8.4–10.5)
Chloride: 104 mEq/L (ref 96–112)
Creatinine, Ser: 1.21 mg/dL (ref 0.40–1.50)
GFR: 56.09 mL/min — ABNORMAL LOW (ref >60.00)
Glucose, Bld: 90 mg/dL (ref 70–99)
Potassium: 4.3 mEq/L (ref 3.5–5.1)
Sodium: 136 mEq/L (ref 135–145)

## 2020-02-18 NOTE — Progress Notes (Signed)
Brandon [name], Critical Care, and Sleep Medicine  Chief Complaint  Patient presents with   Consult    sleep consult/feels like something stuck in throat when lays down    Constitutional:  BP 120/62 (BP Location: Left Arm, Cuff Size: Normal)    Pulse 61    Temp 97.8 F (36.6 C) (Other (Comment)) Comment (Src): wrist   Ht 5' 6.5" (1.689 m)    Wt 193 lb (87.5 kg)    SpO2 96% Comment: Room air   BMI 30.68 kg/m   Past Medical History:  BPH, PAD, Colon polyps, ED, GERD, HLD, HTN, Back pain  Past Surgical History:  His  has a past surgical history that includes Inguinal hernia repair (2001); Prostate biopsy (Fall 2009); Colonoscopy; and Mass excision (04/06/2011).  Brief Summary:  Brandon [name] is a 79 y.o. male with remote history of smoking presents for assessment of snoring and daytime sleepiness.       Subjective:   He was seen by his PCP over the Summer.  He had more trouble with blood pressure control since he had carpal tunnel surgery and is now taking several medications for this.  He snores and wakes up hearing himself snore.  He doesn't feel rested during the day.  He takes a power nap for 10 to 15 minutes, but can also fall asleep while watching TV.  He goes to sleep at 10 pm.  He falls asleep in 10 minutes.  He wakes up 1 or 2 times to use the bathroom.  He gets out of bed at 7 am.  He feels okay in the morning.  He denies morning headache.  He does not use anything to help him stay awake.  He will take tylenol PM to help fall asleep, and sometimes takes ibuprofen to help with joint pains during the night.  He denies sleep walking, sleep talking, bruxism, or nightmares.  There is no history of restless legs.  He denies sleep hallucinations, sleep paralysis, or cataplexy.  The Epworth score is 5 out of 24.    Physical Exam:   Appearance - well kempt   ENMT - no sinus tenderness, no oral exudate, no LAN, Mallampati 4 airway, no stridor, elongated uvula,  scalloped tongue  Respiratory - equal breath sounds bilaterally, no wheezing or rales  CV - s1s2 regular rate and rhythm, no murmurs  Ext - no clubbing, no edema  Skin - no rashes  Psych - normal mood and affect   Sleep Tests:    Cardiac Tests:   Echo 06/16/19 >> EF 55 to 60^, grade 1 DD, mod LVH, RVSP 13.6 mmHg, mod LA dilation  Social History:  He  reports that he quit smoking about 60 years ago. He has a 1.00 pack-year smoking history. He has never used smokeless tobacco. He reports current alcohol use. He reports that he does not use drugs.  Family History:  His family history includes Cancer in his mother and another family member; Depression in his mother; Heart disease in his father; Stroke in his father.    Discussion:  He has snoring, sleep disruption, apnea, and daytime sleepiness.  He has history of hypertension on multiple medications.  I am concerned he could have sleep apnea.  He also reports globus sensation and has history of GERD.  He has been concerned about ankle swelling since he has been on amlodipine.  Assessment/Plan:   Snoring with excessive daytime sleepiness. - will need to arrange for a home sleep study  Globus sensation. - explained how this could be related to upper airway irritation in setting of snoring and sleep apnea - might need further assessment if sleep study unrevealing  Obesity. - discussed how weight can impact sleep and risk for sleep disordered breathing - discussed options to assist with weight loss: combination of diet modification, cardiovascular and strength training exercises  Cardiovascular risk. - had an extensive discussion regarding the adverse health consequences related to untreated sleep disordered breathing - specifically discussed the risks for hypertension, coronary artery disease, cardiac dysrhythmias, cerebrovascular disease, and diabetes - lifestyle modification discussed  Safe driving practices. - discussed  how sleep disruption can increase risk of accidents, particularly when driving - safe driving practices were discussed  Therapies for obstructive sleep apnea. - if the sleep study shows significant sleep apnea, then various therapies for treatment were reviewed: CPAP, oral appliance, and surgical interventions  Ankle edema. - explained how this can be a side effect from amlodipine - advised him to d/w his PCP whether he could try an alternative antihypertensive agent   Time Spent Involved in Patient Care on Day of Examination:  33 minutes  Follow up:  Patient Instructions  Will arrange for home sleep study Will call to arrange for follow up after sleep study reviewed    Medication List:   Allergies as of 02/18/2020   No Known Allergies     Medication List       Accurate as of February 18, 2020  9:25 AM. If you have any questions, ask your nurse or doctor.        STOP taking these medications   TYLENOL ARTHRITIS PAIN PO Stopped by: Chesley Mires, MD     TAKE these medications   amLODipine 5 MG tablet Commonly known as: NORVASC TAKE 1 TABLET BY MOUTH ONCE DAILY . APPOINTMENT REQUIRED FOR FUTURE REFILLS   aspirin EC 81 MG tablet Take 81 mg by mouth daily. Swallow whole.   calcium & magnesium carbonates 311-232 MG tablet Commonly known as: MYLANTA Take 1 tablet by mouth daily.   cholecalciferol 1000 units tablet Commonly known as: VITAMIN D Take 5,000 Units by mouth daily.   diphenhydramine-acetaminophen 25-500 MG Tabs tablet Commonly known as: TYLENOL PM Take 1 tablet by mouth at bedtime as needed.   finasteride 5 MG tablet Commonly known as: PROSCAR Take 5 mg by mouth daily.   furosemide 20 MG tablet Commonly known as: Lasix Take 1 tablet (20 mg total) by mouth daily as needed for edema.   labetalol 200 MG tablet Commonly known as: NORMODYNE Take 200 mg by mouth 2 (two) times daily.   losartan 100 MG tablet Commonly known as: COZAAR Take 1 tablet  (100 mg total) by mouth daily.   multivitamin tablet Take 1 tablet by mouth daily.   omeprazole 20 MG capsule Commonly known as: PRILOSEC Take 1 capsule (20 mg total) by mouth daily. TAKE 1 CAPSULE BY MOUTH ONCE DAILY AS NEEDED   rosuvastatin 20 MG tablet Commonly known as: CRESTOR Take 1 tablet by mouth once daily   vitamin B-6 500 MG tablet Take 500 mg by mouth daily.   vitamin C 500 MG tablet Commonly known as: ASCORBIC ACID Take 500 mg by mouth daily.       Signature:  Chesley Mires, MD [place] Pager - [phone] 02/18/2020, 9:25 AM

## 2020-02-18 NOTE — Patient Instructions (Signed)
Will arrange for home sleep study Will call to arrange for follow up after sleep study reviewed  

## 2020-02-19 ENCOUNTER — Encounter: Payer: Self-pay | Admitting: Internal Medicine

## 2020-03-11 ENCOUNTER — Other Ambulatory Visit: Payer: Self-pay

## 2020-03-11 ENCOUNTER — Encounter: Payer: Self-pay | Admitting: Internal Medicine

## 2020-03-11 ENCOUNTER — Ambulatory Visit (INDEPENDENT_AMBULATORY_CARE_PROVIDER_SITE_OTHER): Payer: Medicare Other | Admitting: Internal Medicine

## 2020-03-11 VITALS — BP 134/74 | HR 64 | Temp 98.2°F | Resp 16 | Ht 66.5 in | Wt 196.0 lb

## 2020-03-11 DIAGNOSIS — I10 Essential (primary) hypertension: Secondary | ICD-10-CM

## 2020-03-11 DIAGNOSIS — K5904 Chronic idiopathic constipation: Secondary | ICD-10-CM | POA: Diagnosis not present

## 2020-03-11 DIAGNOSIS — K219 Gastro-esophageal reflux disease without esophagitis: Secondary | ICD-10-CM | POA: Diagnosis not present

## 2020-03-11 LAB — MAGNESIUM: Magnesium: 2 mg/dL (ref 1.5–2.5)

## 2020-03-11 LAB — CBC WITH DIFFERENTIAL/PLATELET
Basophils Absolute: 0.1 10*3/uL (ref 0.0–0.1)
Basophils Relative: 0.9 % (ref 0.0–3.0)
Eosinophils Absolute: 0.3 10*3/uL (ref 0.0–0.7)
Eosinophils Relative: 4.1 % (ref 0.0–5.0)
HCT: 41 % (ref 39.0–52.0)
Hemoglobin: 13.8 g/dL (ref 13.0–17.0)
Lymphocytes Relative: 16.9 % (ref 12.0–46.0)
Lymphs Abs: 1.1 10*3/uL (ref 0.7–4.0)
MCHC: 33.8 g/dL (ref 30.0–36.0)
MCV: 89.7 fl (ref 78.0–100.0)
Monocytes Absolute: 0.6 10*3/uL (ref 0.1–1.0)
Monocytes Relative: 9.3 % (ref 3.0–12.0)
Neutro Abs: 4.5 10*3/uL (ref 1.4–7.7)
Neutrophils Relative %: 68.8 % (ref 43.0–77.0)
Platelets: 267 10*3/uL (ref 150.0–400.0)
RBC: 4.57 Mil/uL (ref 4.22–5.81)
RDW: 13.8 % (ref 11.5–15.5)
WBC: 6.6 10*3/uL (ref 4.0–10.5)

## 2020-03-11 MED ORDER — OMEPRAZOLE 20 MG PO CPDR: 20.0000 mg | DELAYED_RELEASE_CAPSULE | Freq: Every day | ORAL | 3 refills | Status: AC

## 2020-03-11 MED ORDER — LUBIPROSTONE 24 MCG PO CAPS: 24.0000 ug | ORAL_CAPSULE | Freq: Two times a day (BID) | ORAL | 1 refills | Status: DC

## 2020-03-11 NOTE — Progress Notes (Signed)
Subjective:  Patient ID: Brandon [name], male    DOB: 03/06/1941  Age: 79 y.o. MRN: [id number]  CC: Hypertension  This visit occurred during the SARS-CoV-2 public health emergency.  Safety protocols were in place, including screening questions prior to the visit, additional usage of staff PPE, and extensive cleaning of exam room while observing appropriate contact time as indicated for disinfecting solutions.   NEW TO ME  HPI Brandon [name] presents for f/up- He complains of chronic constipation but over the last month says the constipation is worsening and he has had straining and and has noticed a few episodes of blood in his stool.  He has seen about a tablespoon of stool.  He takes benadryl, Tums and amlodipine.  He recently experienced dizziness and lightheadedness so he stopped taking the loop diuretic.  He denies abdominal pain, bloating, nausea, vomiting, fever, or chills.  The constipation does not improve much with MiraLAX.  His recent labs were negative for secondary causes of constipation.  He has heartburn but denies odynophagia and dysphagia.  Outpatient Medications Prior to Visit  Medication Sig Dispense Refill  . cholecalciferol (VITAMIN D) 1000 units tablet Take 5,000 Units by mouth daily.     . finasteride (PROSCAR) 5 MG tablet Take 5 mg by mouth daily.      Marland Kitchen labetalol (NORMODYNE) 200 MG tablet Take 200 mg by mouth 2 (two) times daily.    Marland Kitchen losartan (COZAAR) 100 MG tablet Take 1 tablet (100 mg total) by mouth daily. 90 tablet 3  . Multiple Vitamin (MULTIVITAMIN) tablet Take 1 tablet by mouth daily.      . Polyethylene Glycol 3350 (MIRALAX PO) Take by mouth.    . Pyridoxine HCl (VITAMIN B-6) 500 MG tablet Take 500 mg by mouth daily.    . rosuvastatin (CRESTOR) 20 MG tablet Take 1 tablet by mouth once daily 90 tablet 2  . vitamin C (ASCORBIC ACID) 500 MG tablet Take 500 mg by mouth daily.    Marland Kitchen amLODipine (NORVASC) 5 MG tablet TAKE 1 TABLET BY MOUTH ONCE DAILY .  APPOINTMENT REQUIRED FOR FUTURE REFILLS 90 tablet 0  . aspirin EC 81 MG tablet Take 81 mg by mouth daily. Swallow whole.    . calcium & magnesium carbonates (MYLANTA) 311-232 MG per tablet Take 1 tablet by mouth daily.    . diphenhydramine-acetaminophen (TYLENOL PM) 25-500 MG TABS tablet Take 1 tablet by mouth at bedtime as needed.    . furosemide (LASIX) 20 MG tablet Take 1 tablet (20 mg total) by mouth daily as needed for edema. 30 tablet 11  . omeprazole (PRILOSEC) 20 MG capsule Take 1 capsule (20 mg total) by mouth daily. TAKE 1 CAPSULE BY MOUTH ONCE DAILY AS NEEDED 90 capsule 3   No facility-administered medications prior to visit.    ROS Review of Systems  Constitutional: Negative for appetite change, chills, diaphoresis, fatigue, fever and unexpected weight change.  HENT: Negative.  Negative for trouble swallowing.   Eyes: Negative.   Respiratory: Negative for cough, chest tightness, shortness of breath and wheezing.   Cardiovascular: Negative for chest pain, palpitations and leg swelling.  Gastrointestinal: Positive for anal bleeding, blood in stool and constipation. Negative for abdominal pain, diarrhea, nausea and vomiting.  Endocrine: Negative.   Genitourinary: Negative.  Negative for difficulty urinating, dysuria and hematuria.  Musculoskeletal: Negative for arthralgias, joint swelling and myalgias.  Skin: Negative.  Negative for color change and pallor.  Neurological: Negative.  Negative for dizziness, weakness and light-headedness.  Hematological: Negative for adenopathy. Does not bruise/bleed easily.  Psychiatric/Behavioral: Negative.     Objective:  BP 134/74   Pulse 64   Temp 98.2 F (36.8 C) (Oral)   Resp 16   Ht 5' 6.5" (1.689 m)   Wt 196 lb (88.9 kg)   SpO2 95%   BMI 31.16 kg/m   BP Readings from Last 3 Encounters:  03/11/20 134/74  02/18/20 120/62  02/12/20 130/70    Wt Readings from Last 3 Encounters:  03/11/20 196 lb (88.9 kg)  02/18/20 193 lb  (87.5 kg)  02/12/20 193 lb (87.5 kg)    Physical Exam Vitals reviewed.  Constitutional:      Appearance: Normal appearance.  HENT:     Nose: Nose normal.     Mouth/Throat:     Mouth: Mucous membranes are moist.  Eyes:     General: No scleral icterus.    Conjunctiva/sclera: Conjunctivae normal.  Cardiovascular:     Rate and Rhythm: Normal rate and regular rhythm.     Heart sounds: No murmur heard.   Pulmonary:     Breath sounds: No stridor. No wheezing, rhonchi or rales.  Abdominal:     General: Abdomen is protuberant. Bowel sounds are normal. There is no distension.     Palpations: Abdomen is soft. There is no hepatomegaly, splenomegaly or mass.     Hernia: No hernia is present.  Genitourinary:    Pubic Area: No rash.      Prostate: Enlarged. Not tender and no nodules present.     Rectum: Guaiac result positive. Internal hemorrhoid present. No mass, tenderness, anal fissure or external hemorrhoid. Normal anal tone.     Comments: Uncomplicated int anal hemorrhoids and scant BRB Musculoskeletal:     Cervical back: Neck supple.  Lymphadenopathy:     Cervical: No cervical adenopathy.  Neurological:     Mental Status: He is alert.     Lab Results  Component Value Date   WBC 6.6 03/11/2020   HGB 13.8 03/11/2020   HCT 41.0 03/11/2020   PLT 267.0 03/11/2020   GLUCOSE 90 02/18/2020   CHOL 127 11/26/2019   TRIG 91.0 11/26/2019   HDL 53.90 11/26/2019   LDLCALC 55 11/26/2019   ALT 18 11/26/2019   AST 20 11/26/2019   NA 136 02/18/2020   K 4.3 02/18/2020   CL 104 02/18/2020   CREATININE 1.21 02/18/2020   BUN 25 (H) 02/18/2020   CO2 25 02/18/2020   TSH 1.19 11/26/2019   PSA 2.47 11/26/2019    No results found.  Assessment & Plan:   Nuel was seen today for hypertension.  Diagnoses and all orders for this visit:  Chronic idiopathic constipation- His labs are negative for secondary causes of constipation.  I recommended that he stop taking Benadryl, Tums, and  amlodipine all of which can worsen constipation.  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 -     Magnesium; Future -     lubiprostone (AMITIZA) 24 MCG capsule; Take 1 capsule (24 mcg total) by mouth 2 (two) times daily with a meal. -     Magnesium  GERD without esophagitis- Will treat this with the PPI. -     CBC with Differential/Platelet; Future -     omeprazole (PRILOSEC) 20 MG capsule; Take 1 capsule (20 mg total) by mouth daily. TAKE 1 CAPSULE BY MOUTH ONCE DAILY AS NEEDED -     CBC with Differential/Platelet  Essential hypertension- His blood pressure is somewhat overcontrolled for his age.  I recommend that he stop taking amlodipine and the loop diuretic.   I have discontinued [place] Lundberg "Sal"'s calcium & magnesium carbonates, furosemide, amLODipine, diphenhydramine-acetaminophen, and aspirin EC. I am also having him start on lubiprostone. Additionally, I am having him maintain his finasteride, multivitamin, vitamin C, cholecalciferol, losartan, rosuvastatin, vitamin B-6, labetalol, Polyethylene Glycol 3350 (MIRALAX PO), and omeprazole.  Meds ordered this encounter  Medications  . lubiprostone (AMITIZA) 24 MCG capsule    Sig: Take 1 capsule (24 mcg total) by mouth 2 (two) times daily with a meal.    Dispense:  180 capsule    Refill:  1  . omeprazole (PRILOSEC) 20 MG capsule    Sig: Take 1 capsule (20 mg total) by mouth daily. TAKE 1 CAPSULE BY MOUTH ONCE DAILY AS NEEDED    Dispense:  90 capsule    Refill:  3     Follow-up: Return in about 4 months (around 07/09/2020).  Scarlette Calico, MD

## 2020-03-11 NOTE — Patient Instructions (Signed)

## 2020-03-15 ENCOUNTER — Telehealth: Payer: Self-pay | Admitting: Internal Medicine

## 2020-03-15 NOTE — Telephone Encounter (Signed)
Ok with me 

## 2020-03-15 NOTE — Telephone Encounter (Signed)
Patient was wondering if he could do a transfer of care from Dr. Jenny Reichmann to Dr. Ronnald Ramp. Just let me know, thank you.

## 2020-03-22 ENCOUNTER — Other Ambulatory Visit: Payer: Self-pay

## 2020-03-22 ENCOUNTER — Ambulatory Visit: Payer: Medicare Other

## 2020-03-22 DIAGNOSIS — G4733 Obstructive sleep apnea (adult) (pediatric): Secondary | ICD-10-CM | POA: Diagnosis not present

## 2020-03-22 DIAGNOSIS — R0683 Snoring: Secondary | ICD-10-CM

## 2020-03-23 ENCOUNTER — Ambulatory Visit: Payer: Medicare Other | Admitting: Internal Medicine

## 2020-03-23 ENCOUNTER — Encounter: Payer: Self-pay | Admitting: Internal Medicine

## 2020-03-23 VITALS — BP 150/80 | HR 66 | Ht 66.5 in | Wt 197.0 lb

## 2020-03-23 DIAGNOSIS — K644 Residual hemorrhoidal skin tags: Secondary | ICD-10-CM

## 2020-03-23 DIAGNOSIS — K5904 Chronic idiopathic constipation: Secondary | ICD-10-CM | POA: Diagnosis not present

## 2020-03-23 NOTE — Patient Instructions (Addendum)
If you are age 80 or older, your body mass index should be between 23-30. Your Body mass index is 31.32 kg/m. If this is out of the aforementioned range listed, please consider follow up with your Primary Care Provider.  Please contact our office if symptoms worsen or fail to improve.  Thank you for entrusting me with your care and choosing [facility].  Dr Silvano Rusk, MD

## 2020-03-23 NOTE — Progress Notes (Signed)
Brandon [name] 79 y.o. 1941/02/25 [id number]  Assessment & Plan:   Encounter Diagnoses  Name Primary?  . Bleeding external hemorrhoids Yes  . Chronic idiopathic constipation     He seems to be symptomatically improved.  I think he is on the right treatment course.  Removal of constipating medications and addition of Amitiza have helped significantly.  Clinical scenario is very compatible with anorectal bleeding.  We will not pursue colonoscopy.  If the bleeding fails to stop or worsens etc. we could reconsider.  I appreciate the opportunity to care for this patient. CC: Brandon Borg, MD    Subjective:   Chief Complaint: Rectal bleeding  HPI 79 year old white man with a history of hemorrhoidal bleeding in the past, last colonoscopy normal in 2011.  History of polyps in the 1990s.  He developed some constipation recently stools became somewhat large she did not have any anal or rectal pain.  But he was having difficulty defecating.  He went to primary care and saw Dr. Ronnald [name] who noted that he was probably a little overtreated for his blood pressure and discontinued medications which the patient had attributed as a cause of his constipation.  He has felt better since then and is also been placed on Amitiza.  He was having fairly frequent rectal bleeding on the toilet paper and sometimes into the commode all bright red.  Since the medication changes above the bleeding has declined in frequency and volume.  He feels like he is making progress.  Lab Results  Component Value Date   WBC 6.6 03/11/2020   HGB 13.8 03/11/2020   HCT 41.0 03/11/2020   MCV 89.7 03/11/2020   PLT 267.0 03/11/2020      No Known Allergies Current Meds  Medication Sig  . cholecalciferol (VITAMIN D) 1000 units tablet Take 5,000 Units by mouth daily.   . finasteride (PROSCAR) 5 MG tablet Take 5 mg by mouth daily.    Marland Kitchen labetalol (NORMODYNE) 200 MG tablet Take 200 mg by mouth 2 (two) times daily.  Marland Kitchen  losartan (COZAAR) 100 MG tablet Take 1 tablet (100 mg total) by mouth daily.  Marland Kitchen lubiprostone (AMITIZA) 24 MCG capsule Take 1 capsule (24 mcg total) by mouth 2 (two) times daily with a meal.  . Multiple Vitamin (MULTIVITAMIN) tablet Take 1 tablet by mouth daily.    Marland Kitchen omeprazole (PRILOSEC) 20 MG capsule Take 1 capsule (20 mg total) by mouth daily. TAKE 1 CAPSULE BY MOUTH ONCE DAILY AS NEEDED  . Polyethylene Glycol 3350 (MIRALAX PO) Take by mouth.  . Pyridoxine HCl (VITAMIN B-6) 500 MG tablet Take 500 mg by mouth daily.  . rosuvastatin (CRESTOR) 20 MG tablet Take 1 tablet by mouth once daily  . vitamin C (ASCORBIC ACID) 500 MG tablet Take 500 mg by mouth daily.   Past Medical History:  Diagnosis Date  . 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 BENIGN PROSTATIC HYPERTROPHY 11/15/2006  . 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 COLONIC POLYPS, HX OF 11/15/2006  . ERECTILE DYSFUNCTION 11/15/2006  . GERD 11/15/2006  . HYPERLIPIDEMIA 11/15/2006  . HYPERTENSION 11/15/2006   borderline  . Lipoma 11/29/2010  . LOW BACK PAIN 11/14/2007   recurrent  . PARESTHESIA, HANDS 11/23/2008  . PERIPHERAL VASCULAR DISEASE 11/25/2009   right carotid 60-79% July 2011  . PSA, INCREASED 11/14/2007  . 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 CARPAL  TUNNEL RELEASE Bilateral   . COLONOSCOPY    . INGUINAL HERNIA REPAIR  2001  . MASS EXCISION  04/06/2011   Procedure: EXCISION MASS;  Surgeon: Judieth Keens, DO;  Location: [facility];  Service: General;  Laterality: Right;  excision right  upper back mass  . PROSTATE BIOPSY  Fall 2009   S/P-neg-Dr. Jeffie Pollock   Social History   Social History Narrative   Married with 3 children   Retired Scientist, forensic and supervisor   family history includes Cancer in his mother and another family member; Depression in his mother; Heart disease  in his father; Stroke in his father.   Review of Systems As per HPI  Objective:   Physical Exam BP (!) 150/80 (BP Location: Left Arm, Patient Position: Sitting, Cuff Size: Normal)   Pulse 66   Ht 5' 6.5" (1.689 m)   Wt 197 lb (89.4 kg)   SpO2 98%   BMI 31.32 kg/m  Robust 79 year old white man looking younger than stated age Abdomen mildly obese Rectal exam shows some mild anal stenosis/spasm.  There is a tiny tag/pile in the middle of the left side but no perianal dermatitis or protruding hemorrhoids.  Prostate is enlarged this is known.  Anoscopic exam was performed and he has moderately inflamed external hemorrhoids that appear to be grade 1 and grade 1 internal hemorrhoids without inflammatory change or stigmata of bleeding.  There is some violaceous discoloration of the external hemorrhoids consistent with bleeding.

## 2020-03-26 ENCOUNTER — Other Ambulatory Visit: Payer: Self-pay | Admitting: Internal Medicine

## 2020-03-27 NOTE — Telephone Encounter (Signed)
Please refill as per office routine med refill policy (all routine meds refilled for 3 mo or monthly per pt preference up to one year from last visit, then month to month grace period for 3 mo, then further med refills will have to be denied)  

## 2020-03-30 ENCOUNTER — Telehealth: Payer: Self-pay | Admitting: Pulmonary Disease

## 2020-03-30 DIAGNOSIS — G4733 Obstructive sleep apnea (adult) (pediatric): Secondary | ICD-10-CM | POA: Diagnosis not present

## 2020-03-30 NOTE — Telephone Encounter (Signed)
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  Please arrange for ROV with me or NP to discuss treatment options.

## 2020-04-01 NOTE — Telephone Encounter (Signed)
Pt is asking to speak w/ someone regarding his HST results.  Pt can be reached at [phone].

## 2020-04-01 NOTE — Telephone Encounter (Signed)
Pt returned call

## 2020-04-01 NOTE — Telephone Encounter (Signed)
LMTC x 1  

## 2020-04-01 NOTE — Telephone Encounter (Signed)
ATC patient unable to reach LM to call back office (x1)  

## 2020-04-02 NOTE — Telephone Encounter (Signed)
Called spoke with patient  Scheduled with NP for follow up  Nothing further needed at this time.

## 2020-04-02 NOTE — Telephone Encounter (Signed)
[phone] pt calling back

## 2020-04-05 ENCOUNTER — Ambulatory Visit (INDEPENDENT_AMBULATORY_CARE_PROVIDER_SITE_OTHER): Payer: Medicare Other | Admitting: Internal Medicine

## 2020-04-05 ENCOUNTER — Other Ambulatory Visit: Payer: Self-pay

## 2020-04-05 ENCOUNTER — Encounter: Payer: Self-pay | Admitting: Internal Medicine

## 2020-04-05 VITALS — BP 140/82 | HR 66 | Temp 98.3°F | Ht 66.5 in | Wt 193.0 lb

## 2020-04-05 DIAGNOSIS — K5904 Chronic idiopathic constipation: Secondary | ICD-10-CM | POA: Diagnosis not present

## 2020-04-05 DIAGNOSIS — M159 Polyosteoarthritis, unspecified: Secondary | ICD-10-CM

## 2020-04-05 DIAGNOSIS — Z23 Encounter for immunization: Secondary | ICD-10-CM | POA: Diagnosis not present

## 2020-04-05 DIAGNOSIS — N1831 Chronic kidney disease, stage 3a: Secondary | ICD-10-CM

## 2020-04-05 DIAGNOSIS — M8949 Other hypertrophic osteoarthropathy, multiple sites: Secondary | ICD-10-CM | POA: Diagnosis not present

## 2020-04-05 DIAGNOSIS — I10 Essential (primary) hypertension: Secondary | ICD-10-CM | POA: Diagnosis not present

## 2020-04-05 MED ORDER — TRAMADOL HCL 50 MG PO TABS: 50.0000 mg | ORAL_TABLET | Freq: Two times a day (BID) | ORAL | 1 refills | Status: DC | PRN

## 2020-04-05 NOTE — Progress Notes (Signed)
Subjective:  Patient ID: Brandon [name], male    DOB: [birth year]  Age: 79 y.o. MRN: [id number]  CC: Hypertension and Osteoarthritis  This visit occurred during the SARS-CoV-2 public health emergency.  Safety protocols were in place, including screening questions prior to the visit, additional usage of staff PPE, and extensive cleaning of exam room while observing appropriate contact time as indicated for disinfecting solutions.    HPI Brandon [name] presents for f/up - He tells me that his blood pressure is well controlled on the combination of losartan and labetalol. He denies lower extremity edema, headache, blurred vision, chest pain, or shortness of breath. His bowel movements have improved with the combination of Amitiza and MiraLAX. He complains of pain in his large joints. The pain interferes with his sleep and activities so he has been taking Motrin and Tylenol as needed. He is concerned that his recent kidney function showed a slight decrease in his GFR.  Outpatient Medications Prior to Visit  Medication Sig Dispense Refill  . cholecalciferol (VITAMIN D) 1000 units tablet Take 5,000 Units by mouth daily.     . finasteride (PROSCAR) 5 MG tablet Take 5 mg by mouth daily.      Marland Kitchen labetalol (NORMODYNE) 200 MG tablet Take 200 mg by mouth 2 (two) times daily.    Marland Kitchen losartan (COZAAR) 100 MG tablet Take 1 tablet by mouth once daily 90 tablet 0  . lubiprostone (AMITIZA) 24 MCG capsule Take 1 capsule (24 mcg total) by mouth 2 (two) times daily with a meal. 180 capsule 1  . Multiple Vitamin (MULTIVITAMIN) tablet Take 1 tablet by mouth daily.      Marland Kitchen omeprazole (PRILOSEC) 20 MG capsule Take 1 capsule (20 mg total) by mouth daily. TAKE 1 CAPSULE BY MOUTH ONCE DAILY AS NEEDED 90 capsule 3  . Polyethylene Glycol 3350 (MIRALAX PO) Take by mouth.    . Pyridoxine HCl (VITAMIN B-6) 500 MG tablet Take 500 mg by mouth daily.    . rosuvastatin (CRESTOR) 20 MG tablet Take 1 tablet by mouth once daily 90  tablet 0  . vitamin C (ASCORBIC ACID) 500 MG tablet Take 500 mg by mouth daily.     No facility-administered medications prior to visit.    ROS Review of Systems  Constitutional: Negative for chills, diaphoresis, fatigue and fever.  HENT: Negative.   Eyes: Negative.   Respiratory: Negative for cough, chest tightness, shortness of breath and wheezing.   Cardiovascular: Negative for chest pain, palpitations and leg swelling.  Gastrointestinal: Negative for abdominal pain, constipation, diarrhea, nausea and vomiting.  Genitourinary: Negative.  Negative for difficulty urinating, dysuria, hematuria and urgency.  Musculoskeletal: Positive for arthralgias. Negative for myalgias and neck pain.  Skin: Negative.  Negative for color change and pallor.  Neurological: Negative.  Negative for dizziness, weakness and headaches.  Hematological: Negative for adenopathy. Does not bruise/bleed easily.  Psychiatric/Behavioral: Negative.     Objective:  BP 140/82   Pulse 66   Temp 98.3 F (36.8 C) (Oral)   Ht 5' 6.5" (1.689 m)   Wt 193 lb (87.5 kg)   SpO2 96%   BMI 30.68 kg/m   BP Readings from Last 3 Encounters:  04/05/20 140/82  03/23/20 (!) 150/80  03/11/20 134/74    Wt Readings from Last 3 Encounters:  04/05/20 193 lb (87.5 kg)  03/23/20 197 lb (89.4 kg)  03/11/20 196 lb (88.9 kg)    Physical Exam Vitals reviewed.  HENT:     Nose: Nose normal.  Mouth/Throat:     Mouth: Mucous membranes are moist.  Eyes:     General: No scleral icterus.    Conjunctiva/sclera: Conjunctivae normal.  Cardiovascular:     Rate and Rhythm: Normal rate and regular rhythm.     Heart sounds: No murmur heard.   Pulmonary:     Effort: Pulmonary effort is normal.     Breath sounds: No stridor. No wheezing, rhonchi or rales.  Abdominal:     General: Abdomen is flat. Bowel sounds are normal. There is no distension.     Palpations: Abdomen is soft. There is no hepatomegaly, splenomegaly or mass.      Tenderness: There is no abdominal tenderness.  Musculoskeletal:        General: Normal range of motion.     Cervical back: Neck supple.     Right lower leg: No edema.     Left lower leg: No edema.  Lymphadenopathy:     Cervical: No cervical adenopathy.  Skin:    General: Skin is warm and dry.  Neurological:     General: No focal deficit present.     Mental Status: He is alert.  Psychiatric:        Mood and Affect: Mood normal.        Behavior: Behavior normal.     Lab Results  Component Value Date   WBC 6.6 03/11/2020   HGB 13.8 03/11/2020   HCT 41.0 03/11/2020   PLT 267.0 03/11/2020   GLUCOSE 90 02/18/2020   CHOL 127 11/26/2019   TRIG 91.0 11/26/2019   HDL 53.90 11/26/2019   LDLCALC 55 11/26/2019   ALT 18 11/26/2019   AST 20 11/26/2019   NA 136 02/18/2020   K 4.3 02/18/2020   CL 104 02/18/2020   CREATININE 1.21 02/18/2020   BUN 25 (H) 02/18/2020   CO2 25 02/18/2020   TSH 1.19 11/26/2019   PSA 2.47 11/26/2019    No results found.  Assessment & Plan:   Brandon [name] was seen today for hypertension and osteoarthritis.  Diagnoses and all orders for this visit:  Essential hypertension- His blood pressure is adequately well controlled.  Primary osteoarthritis involving multiple joints- I recommended that he control the pain with tramadol as needed. -     traMADol (ULTRAM) 50 MG tablet; Take 1 tablet (50 mg total) by mouth every 12 (twelve) hours as needed.  Chronic idiopathic constipation- Improvement noted. The evaluation for secondary causes was unremarkable. Will continue the combination of Amitiza and MiraLAX.  Stage 3a chronic kidney disease ([place])- Jis blood pressure is adequately well controlled. He agrees to stop taking ibuprofen.  Other orders -     Pneumococcal polysaccharide vaccine 23-valent greater than or equal to 2yo subcutaneous/IM   I am having Brandon Canal Bouley "Sal" start on traMADol. I am also having him maintain his finasteride, multivitamin,  vitamin C, cholecalciferol, vitamin B-6, labetalol, Polyethylene Glycol 3350 (MIRALAX PO), lubiprostone, omeprazole, rosuvastatin, and losartan.  Meds ordered this encounter  Medications  . traMADol (ULTRAM) 50 MG tablet    Sig: Take 1 tablet (50 mg total) by mouth every 12 (twelve) hours as needed.    Dispense:  180 tablet    Refill:  1     Follow-up: Return in about 6 months (around 10/04/2020).  Scarlette Calico, MD

## 2020-04-05 NOTE — Patient Instructions (Signed)

## 2020-04-06 DIAGNOSIS — N1831 Chronic kidney disease, stage 3a: Secondary | ICD-10-CM | POA: Insufficient documentation

## 2020-04-07 ENCOUNTER — Other Ambulatory Visit: Payer: Self-pay

## 2020-04-07 ENCOUNTER — Ambulatory Visit: Payer: Medicare Other | Admitting: Adult Health

## 2020-04-07 ENCOUNTER — Encounter: Payer: Self-pay | Admitting: Adult Health

## 2020-04-07 VITALS — BP 142/72 | HR 55 | Temp 98.1°F | Ht 68.0 in | Wt 192.8 lb

## 2020-04-07 DIAGNOSIS — J3089 Other allergic rhinitis: Secondary | ICD-10-CM

## 2020-04-07 DIAGNOSIS — G4733 Obstructive sleep apnea (adult) (pediatric): Secondary | ICD-10-CM | POA: Insufficient documentation

## 2020-04-07 NOTE — Progress Notes (Signed)
@Patient  ID: Brandon [name], male    DOB: 26-Oct-1940, 79 y.o.   MRN: [id number]  Chief Complaint  Patient presents with  . Follow-up    Referring provider: [name], MD  HPI: 79 year old seen October 2021 for sleep consult found to have moderate obstructive sleep apnea  TEST/EVENTS :  HST 03/22/20 >> AHI 22.9, SpO2 low 62%  04/07/2020 Follow up : OSA  Patient returns for follow up . Seen last office visit for sleep consult.  Patient had daytime sleepiness and restless sleep.  Along with snoring.  He was set up for home sleep study done on March 22, 2020 found to have moderate sleep apnea with AHI 22.9/hour and SPO2 low at 62%.  We discussed his test results.  Went over patient education on sleep apnea and healthy sleep regimen.  We reviewed treatment options including weight loss oral appliance and CPAP.  Patient would like to proceed with nocturnal CPAP.  Patient education on CPAP usage and care.   No Known Allergies  Immunization History  Administered Date(s) Administered  . Influenza Split 02/25/2020  . Influenza-Unspecified 03/02/2015, 03/02/2017, 01/14/2018  . PFIZER SARS-COV-2 Vaccination 06/12/2019, 07/07/2019, 02/04/2020  . Pneumococcal Conjugate-13 12/16/2013  . Pneumococcal Polysaccharide-23 05/01/2004, 11/29/2010, 04/05/2020  . Td 11/23/2008  . Tdap 11/13/2018  . Zoster 04/20/2009    Past Medical History:  Diagnosis Date  . 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 BENIGN PROSTATIC HYPERTROPHY 11/15/2006  . 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 COLONIC POLYPS, HX OF 11/15/2006  . ERECTILE DYSFUNCTION 11/15/2006  . GERD 11/15/2006  . HYPERLIPIDEMIA 11/15/2006  . HYPERTENSION 11/15/2006   borderline  . Lipoma 11/29/2010  . LOW BACK PAIN 11/14/2007   recurrent  . PARESTHESIA, HANDS 11/23/2008  . PERIPHERAL VASCULAR DISEASE 11/25/2009   right carotid 60-79% July 2011  . PSA, INCREASED 11/14/2007  . Sciatica 11/15/2006   with known  lumbardegenerative disk disease and degenerative joint disease    Tobacco History: Social History   Tobacco Use  Smoking Status Former Smoker  . Packs/day: 0.50  . Years: 2.00  . Pack years: 1.00  . Quit date: 02/09/1960  . Years since quitting: 60.2  Smokeless Tobacco Never Used   Counseling given: Not Answered   Outpatient Medications Prior to Visit  Medication Sig Dispense Refill  . cholecalciferol (VITAMIN D) 1000 units tablet Take 5,000 Units by mouth daily.     . finasteride (PROSCAR) 5 MG tablet Take 5 mg by mouth daily.      Marland Kitchen labetalol (NORMODYNE) 200 MG tablet Take 200 mg by mouth 2 (two) times daily.    Marland Kitchen losartan (COZAAR) 100 MG tablet Take 1 tablet by mouth once daily 90 tablet 0  . lubiprostone (AMITIZA) 24 MCG capsule Take 1 capsule (24 mcg total) by mouth 2 (two) times daily with a meal. 180 capsule 1  . Multiple Vitamin (MULTIVITAMIN) tablet Take 1 tablet by mouth daily.      Marland Kitchen omeprazole (PRILOSEC) 20 MG capsule Take 1 capsule (20 mg total) by mouth daily. TAKE 1 CAPSULE BY MOUTH ONCE DAILY AS NEEDED 90 capsule 3  . Polyethylene Glycol 3350 (MIRALAX PO) Take by mouth.    . Pyridoxine HCl (VITAMIN B-6) 500 MG tablet Take 500 mg by mouth daily.    . rosuvastatin (CRESTOR) 20 MG tablet Take 1 tablet by mouth once daily 90 tablet 0  . traMADol (ULTRAM) 50 MG tablet Take 1 tablet (50 mg total) by mouth every 12 (twelve) hours  as needed. 180 tablet 1  . vitamin C (ASCORBIC ACID) 500 MG tablet Take 500 mg by mouth daily.     No facility-administered medications prior to visit.     Review of Systems:   Constitutional:   No  weight loss, night sweats,  Fevers, chills, fatigue, or  lassitude.  HEENT:   No headaches,  Difficulty swallowing,  Tooth/dental problems, or  Sore throat,                No sneezing, itching, ear ache, + nasal congestion, post nasal drip,   CV:  No chest pain,  Orthopnea, PND, swelling in lower extremities, anasarca, dizziness,  palpitations, syncope.   GI  No heartburn, indigestion, abdominal pain, nausea, vomiting, diarrhea, change in bowel habits, loss of appetite, bloody stools.   Resp: No shortness of breath with exertion or at rest.  No excess mucus, no productive cough,  No non-productive cough,  No coughing up of blood.  No change in color of mucus.  No wheezing.  No chest wall deformity  Skin: no rash or lesions.  GU: no dysuria, change in color of urine, no urgency or frequency.  No flank pain, no hematuria   MS:  No joint pain or swelling.  No decreased range of motion.  No back pain.    Physical Exam  BP (!) 142/72 (BP Location: Left Arm, Cuff Size: Normal)   Pulse (!) 55   Temp 98.1 F (36.7 C) (Temporal)   Ht 5\' 8"  (1.727 m)   Wt 192 lb 12.8 oz (87.5 kg)   SpO2 98%   BMI 29.32 kg/m   GEN: A/Ox3; pleasant , NAD, BMI 29    HEENT:  Lipan/AT,  , NOSE-clear, THROAT-clear, no lesions, no postnasal drip or exudate noted. Class 2-3  MP airway   NECK:  Supple w/ fair ROM; no JVD; normal carotid impulses w/o bruits; no thyromegaly or nodules palpated; no lymphadenopathy.    RESP  Clear  P & A; w/o, wheezes/ rales/ or rhonchi. no accessory muscle use, no dullness to percussion  CARD:  RRR, no m/r/g, no peripheral edema, pulses intact, no cyanosis or clubbing.  GI:   Soft & nt; nml bowel sounds; no organomegaly or masses detected.   Musco: Warm bil, no deformities or joint swelling noted.   Neuro: alert, no focal deficits noted.    Skin: Warm, no lesions or rashes    Lab Results:   BMET  BNP No results found for: BNP  ProBNP No results found for: PROBNP  Imaging: No results found.    No flowsheet data found.  No results found for: NITRICOXIDE      Assessment & Plan:   No problem-specific Assessment & Plan notes found for this encounter.     Rexene Edison, NP 04/07/2020

## 2020-04-07 NOTE — Assessment & Plan Note (Signed)
Add saline nasal rinses and Claritin As needed    Plan  Patient Instructions  Begin CPAP At bedtime   Wear all night long while sleeping .  Work on healthy weight .  Remain active  Do not drive if sleepy Avoid sedating medications  Saline nasal rinses Twice daily   Claritin 5mg  At bedtime As needed drainage  Follow up with Dr. Halford Chessman  Or Charlott Calvario NP in 2-3 months and As needed

## 2020-04-07 NOTE — Assessment & Plan Note (Signed)
Moderate OSA - patient education regarding sleep apnea and treatment  Will begin CPAP At bedtime  , autoset 5 to 15cmH2O.   Plan  Patient Instructions  Begin CPAP At bedtime   Wear all night long while sleeping .  Work on healthy weight .  Remain active  Do not drive if sleepy Avoid sedating medications  Saline nasal rinses Twice daily   Claritin 5mg  At bedtime As needed drainage  Follow up with Dr. Halford Chessman  Or Johnel Yielding NP in 2-3 months and As needed

## 2020-04-07 NOTE — Patient Instructions (Addendum)
Begin CPAP At bedtime   Wear all night long while sleeping .  Work on healthy weight .  Remain active  Do not drive if sleepy Avoid sedating medications  Saline nasal rinses Twice daily   Claritin 5mg  At bedtime As needed drainage  Follow up with Dr. Halford Chessman  Or Valeska Haislip NP in 2-3 months and As needed

## 2020-04-12 ENCOUNTER — Telehealth: Payer: Self-pay | Admitting: Internal Medicine

## 2020-04-12 NOTE — Telephone Encounter (Signed)
    Patient calling to report he stopped taking traMADol (ULTRAM) 50 MG tablet. He feels the med is too strong. He has started taking OTC Tylenol

## 2020-05-07 DIAGNOSIS — N138 Other obstructive and reflux uropathy: Secondary | ICD-10-CM | POA: Diagnosis not present

## 2020-05-31 ENCOUNTER — Telehealth: Payer: Self-pay | Admitting: Pulmonary Disease

## 2020-06-15 NOTE — Telephone Encounter (Signed)
Nothing noted in message. Will close encounter.  

## 2020-06-21 ENCOUNTER — Encounter: Payer: Self-pay | Admitting: Pulmonary Disease

## 2020-06-21 ENCOUNTER — Ambulatory Visit: Payer: Medicare Other | Admitting: Pulmonary Disease

## 2020-06-21 ENCOUNTER — Other Ambulatory Visit: Payer: Self-pay

## 2020-06-21 VITALS — BP 128/68 | HR 62 | Temp 97.2°F | Ht 67.0 in | Wt 195.8 lb

## 2020-06-21 DIAGNOSIS — J310 Chronic rhinitis: Secondary | ICD-10-CM

## 2020-06-21 DIAGNOSIS — G4733 Obstructive sleep apnea (adult) (pediatric): Secondary | ICD-10-CM

## 2020-06-21 MED ORDER — FLUTICASONE PROPIONATE 50 MCG/ACT NA SUSP: 1.0000 | Freq: Every day | NASAL | 2 refills | Status: DC

## 2020-06-21 NOTE — Progress Notes (Signed)
[facility]  Chief Complaint  Patient presents with  . Follow-up    No complaints currently    Constitutional:  BP 128/68 (BP Location: Right Arm, Cuff Size: Normal)   Pulse 62   Temp (!) 97.2 F (36.2 C) (Temporal)   Ht 5\' 7"  (1.702 m)   Wt 195 lb 12.8 oz (88.8 kg)   SpO2 98% Comment: Room air  BMI 30.67 kg/m   Past Medical History:  BPH, PAD, Colon polyps, ED, GERD, HLD, HTN, Back pain  Past Surgical History:  His  has a past surgical history that includes Inguinal hernia repair (2001); Prostate biopsy (Fall 2009); Colonoscopy; Mass excision (04/06/2011); and Carpal tunnel release (Bilateral).  Brief Summary:  Brandon [name] is a 80 y.o. male with remote history of smoking presents for assessment of snoring and daytime sleepiness.       Subjective:   He had home sleep study.  Showed moderate sleep apnea.  Hasn't received CPAP yet.    Still has throat irritation and globus feeling.  Claritin didn't help, but nasal rinse does.  Occasionally gets reflux.   Physical Exam:   Appearance - well kempt   ENMT - no sinus tenderness, no oral exudate, no LAN, Mallampati 4 airway, no stridor, scalloped tongue  Respiratory - equal breath sounds bilaterally, no wheezing or rales  CV - s1s2 regular rate and rhythm, no murmurs  Ext - no clubbing, no edema  Skin - no rashes  Psych - normal mood and affect    Sleep Tests:   HST 03/22/20 >> AHI 22.9, SpO2 low 62%  Cardiac Tests:   Echo 06/16/19 >> EF 55 to 60^, grade 1 DD, mod LVH, RVSP 13.6 mmHg, mod LA dilation  Social History:  He  reports that he quit smoking about 60 years ago. He has a 1.00 pack-year smoking history. He has never used smokeless tobacco. He reports current alcohol use. He reports that he does not use drugs.  Family History:  His family history includes Cancer in his mother and another family member; Depression in his mother; Heart disease in his  father; Stroke in his father.    Discussion:  He has snoring, sleep disruption, apnea, and daytime sleepiness.  He has history of hypertension on multiple medications.  I am concerned he could have sleep apnea.  He also reports globus sensation and has history of GERD.  He has been concerned about ankle swelling since he has been on amlodipine.  Assessment/Plan:   Obstructive sleep apnea. - reviewed his sleep study results - he is to get auto CPAP through [facility], but due to national backlog he still hasn't received his device - advised him to call in next few weeks if he still hasn't received his machine  Globus sensation. - likely from chronic rhinitis with post nasal drip and throat irritation in setting of sleep apnea - no improvement with claritin - will have him try fluticasone and continue nasal irrigation  GERD. - prn prilosec through his PCP  Time Spent Involved in Patient Care on Day of Examination:  22 minutes  Follow up:  Patient Instructions  Continue saline nasal rinse nightly  Fluticasone (flonase) nasal spray one spray in each nostril nightly after using nasal rinse  Call in few weeks if you still haven't received your CPAP machine  Follow up in 4 months   Medication List:   Allergies as of 06/21/2020   No Known Allergies  Medication List       Accurate as of June 21, 2020  9:54 AM. If you have any questions, ask your nurse or doctor.        STOP taking these medications   traMADol 50 MG tablet Commonly known as: ULTRAM Stopped by: Chesley Mires, MD     TAKE these medications   cholecalciferol 1000 units tablet Commonly known as: VITAMIN D Take 5,000 Units by mouth daily.   finasteride 5 MG tablet Commonly known as: PROSCAR Take 5 mg by mouth daily.   fluticasone 50 MCG/ACT nasal spray Commonly known as: FLONASE Place 1 spray into both nostrils daily. Started by: Chesley Mires, MD   labetalol 200 MG tablet Commonly known as:  NORMODYNE Take 200 mg by mouth 2 (two) times daily.   losartan 100 MG tablet Commonly known as: COZAAR Take 1 tablet by mouth once daily   lubiprostone 24 MCG capsule Commonly known as: Amitiza Take 1 capsule (24 mcg total) by mouth 2 (two) times daily with a meal.   melatonin 3 MG Tabs tablet Take 3 mg by mouth at bedtime. Takes total of 6mg  HS   MIRALAX PO Take by mouth.   multivitamin tablet Take 1 tablet by mouth daily.   omeprazole 20 MG capsule Commonly known as: PRILOSEC Take 1 capsule (20 mg total) by mouth daily. TAKE 1 CAPSULE BY MOUTH ONCE DAILY AS NEEDED   rosuvastatin 20 MG tablet Commonly known as: CRESTOR Take 1 tablet by mouth once daily   vitamin B-6 500 MG tablet Take 500 mg by mouth daily.   vitamin C 500 MG tablet Commonly known as: ASCORBIC ACID Take 500 mg by mouth daily.       Signature:  Chesley Mires, MD [place] Pager - [phone] 06/21/2020, 9:54 AM

## 2020-06-21 NOTE — Patient Instructions (Signed)
Continue saline nasal rinse nightly  Fluticasone (flonase) nasal spray one spray in each nostril nightly after using nasal rinse  Call in few weeks if you still haven't received your CPAP machine  Follow up in 4 months

## 2020-06-27 ENCOUNTER — Other Ambulatory Visit: Payer: Self-pay | Admitting: Internal Medicine

## 2020-06-27 NOTE — Telephone Encounter (Signed)
Please refill as per office routine med refill policy (all routine meds refilled for 3 mo or monthly per pt preference up to one year from last visit, then month to month grace period for 3 mo, then further med refills will have to be denied)  

## 2020-08-03 DIAGNOSIS — E7800 Pure hypercholesterolemia, unspecified: Secondary | ICD-10-CM | POA: Diagnosis not present

## 2020-08-03 DIAGNOSIS — K5901 Slow transit constipation: Secondary | ICD-10-CM | POA: Diagnosis not present

## 2020-08-03 DIAGNOSIS — N1831 Chronic kidney disease, stage 3a: Secondary | ICD-10-CM | POA: Diagnosis not present

## 2020-08-03 DIAGNOSIS — I129 Hypertensive chronic kidney disease with stage 1 through stage 4 chronic kidney disease, or unspecified chronic kidney disease: Secondary | ICD-10-CM | POA: Diagnosis not present

## 2020-08-28 DIAGNOSIS — G4733 Obstructive sleep apnea (adult) (pediatric): Secondary | ICD-10-CM | POA: Diagnosis not present

## 2020-09-03 ENCOUNTER — Telehealth: Payer: Self-pay | Admitting: Pulmonary Disease

## 2020-09-03 ENCOUNTER — Encounter: Payer: Self-pay | Admitting: *Deleted

## 2020-09-03 NOTE — Telephone Encounter (Signed)
Called and left message on voicemail to please return phone call. Contact number provided. 

## 2020-09-03 NOTE — Telephone Encounter (Signed)
Pt returning a phone call. Pt can be reached at [phone].

## 2020-09-03 NOTE — Telephone Encounter (Signed)
Spoke with pt and he is aware of information   I have sent the report to him via my chart.

## 2020-09-13 DIAGNOSIS — I129 Hypertensive chronic kidney disease with stage 1 through stage 4 chronic kidney disease, or unspecified chronic kidney disease: Secondary | ICD-10-CM | POA: Diagnosis not present

## 2020-09-13 DIAGNOSIS — N1831 Chronic kidney disease, stage 3a: Secondary | ICD-10-CM | POA: Diagnosis not present

## 2020-09-13 DIAGNOSIS — Z79899 Other long term (current) drug therapy: Secondary | ICD-10-CM | POA: Diagnosis not present

## 2020-09-13 DIAGNOSIS — E7800 Pure hypercholesterolemia, unspecified: Secondary | ICD-10-CM | POA: Diagnosis not present

## 2020-09-25 ENCOUNTER — Other Ambulatory Visit: Payer: Self-pay | Admitting: Internal Medicine

## 2020-09-27 DIAGNOSIS — G4733 Obstructive sleep apnea (adult) (pediatric): Secondary | ICD-10-CM | POA: Diagnosis not present

## 2020-10-01 ENCOUNTER — Telehealth: Payer: Self-pay | Admitting: Pulmonary Disease

## 2020-10-01 DIAGNOSIS — G4733 Obstructive sleep apnea (adult) (pediatric): Secondary | ICD-10-CM

## 2020-10-01 NOTE — Telephone Encounter (Signed)
Auto CPAP 09/01/20 to 09/30/20 >> used on 26 of 30 nights with average 4 hrs 13 min.  Average AHI 15 with median CPAP 11 and 95 th percentile CPAP 14 cm H2O.   Please send order to [facility] to have his auto CPAP setting changed to 9 to 18 cm H2O.

## 2020-10-01 NOTE — Telephone Encounter (Signed)
Spoke with the pt and notified of response per Dr Halford Chessman  Pt verbalized understanding and referral send to DME for setting change Nothing further needed

## 2020-10-01 NOTE — Telephone Encounter (Signed)
Called and spoke with Patient.  Patient stated he feels his cpap pressure is not right.  Patient stated he feels he is not getting enough air all night.  Patient stated he wakes every 2-3 hours.  Patient stated he is not feeling rested and feels sleep is worse.  Patient stated he is able to tolerate the mask.  Patient stated he went from nasal to full mask. Patient stated he uses [facility] and has spoke with them about cpap machine, but feels Dr. Halford Chessman needs to advise. Printed Financial trader for Dr. Halford Chessman to review.    Message routed to Dr. Halford Chessman to advise

## 2020-10-28 DIAGNOSIS — G4733 Obstructive sleep apnea (adult) (pediatric): Secondary | ICD-10-CM | POA: Diagnosis not present

## 2020-11-12 ENCOUNTER — Encounter: Payer: Self-pay | Admitting: Pulmonary Disease

## 2020-11-12 ENCOUNTER — Other Ambulatory Visit: Payer: Self-pay

## 2020-11-12 ENCOUNTER — Ambulatory Visit: Payer: Medicare Other | Admitting: Pulmonary Disease

## 2020-11-12 VITALS — BP 118/76 | HR 54 | Temp 97.9°F | Ht 67.0 in | Wt 196.8 lb

## 2020-11-12 DIAGNOSIS — J3089 Other allergic rhinitis: Secondary | ICD-10-CM | POA: Diagnosis not present

## 2020-11-12 DIAGNOSIS — R198 Other specified symptoms and signs involving the digestive system and abdomen: Secondary | ICD-10-CM | POA: Diagnosis not present

## 2020-11-12 DIAGNOSIS — G4733 Obstructive sleep apnea (adult) (pediatric): Secondary | ICD-10-CM

## 2020-11-12 DIAGNOSIS — R0989 Other specified symptoms and signs involving the circulatory and respiratory systems: Secondary | ICD-10-CM

## 2020-11-12 NOTE — Patient Instructions (Signed)
Will arrange for referral to ENT  Follow up in 4 months

## 2020-11-12 NOTE — Progress Notes (Signed)
Brandon [name], Critical Care, and Sleep Medicine  Chief Complaint  Patient presents with   Follow-up    Patient reports dry mouth while sleeping and some dry throat at times and he does not feel that its working and feels like something obstruction his airway at times.       Constitutional:  BP 118/76 (BP Location: Left Arm, Patient Position: Sitting, Cuff Size: Normal)   Pulse (!) 54   Temp 97.9 F (36.6 C) (Oral)   Ht 5\' 7"  (1.702 m)   Wt 196 lb 12.8 oz (89.3 kg)   SpO2 98%   BMI 30.82 kg/m   Past Medical History:  BPH, PAD, Colon polyps, ED, GERD, HLD, HTN, Back pain  Past Surgical History:  His  has a past surgical history that includes Inguinal hernia repair (2001); Prostate biopsy (Fall 2009); Colonoscopy; Mass excision (04/06/2011); and Carpal tunnel release (Bilateral).  Brief Summary:  Brandon [name] is a 80 y.o. male with remote history of smoking presents for assessment of snoring and daytime sleepiness.       Subjective:   He has been using CPAP.  Feels he is sleeping better and more alert.  Having dry mouth.  Had rain out with increased humidity from machine.  No issues with mask fit.  Still getting globus sensation.  Flonase helped some, but still having trouble.   Physical Exam:   Appearance - well kempt   ENMT - no sinus tenderness, no oral exudate, no LAN, Mallampati 4 airway, no stridor, scalloped tongue  Respiratory - equal breath sounds bilaterally, no wheezing or rales  CV - s1s2 regular rate and rhythm, no murmurs  Ext - no clubbing, no edema  Skin - no rashes  Psych - normal mood and affect    Sleep Tests:  HST 03/22/20 >> AHI 22.9, SpO2 low 62% Auto CPAP 10/12/20 to 11/10/20 >> used on 30 of 30 nights with average 5 hrs 46 min.  Average AHI 12.2 with median CPAP 13 and 95 th percentile CPAP 17 cm H2O  Cardiac Tests:  Echo 06/16/19 >> EF 55 to 60^, grade 1 DD, mod LVH, RVSP 13.6 mmHg, mod LA dilation  Social History:  He   reports that he quit smoking about 60 years ago. His smoking use included cigarettes. He has a 1.00 pack-year smoking history. He has never used smokeless tobacco. He reports current alcohol use. He reports that he does not use drugs.  Family History:  His family history includes Cancer in his mother and another family member; Depression in his mother; Heart disease in his father; Stroke in his father.     Assessment/Plan:   Obstructive sleep apnea. - he is compliant with CPAP and reports benefit from therapy - he uses [facility] for his DME - continue auto CPAP 9 to 18 cm H2O - discussed techniques to avoid rainout  Globus sensation. - no improvement with antihistamine, nasal steroid and anti-GERD therapy - will arrange for referral to ENT to further assess  Time Spent Involved in Patient Care on Day of Examination:  24 minutes  Follow up:   Patient Instructions  Will arrange for referral to ENT  Follow up in 4 months  Medication List:   Allergies as of 11/12/2020   No Known Allergies      Medication List        Accurate as of November 12, 2020 10:34 AM. If you have any questions, ask your nurse or doctor.  STOP taking these medications    lubiprostone 24 MCG capsule Commonly known as: Amitiza Stopped by: Chesley Mires, MD   MIRALAX PO Stopped by: Chesley Mires, MD   multivitamin tablet Stopped by: Chesley Mires, MD   vitamin C 500 MG tablet Commonly known as: ASCORBIC ACID Stopped by: Chesley Mires, MD       TAKE these medications    acetaminophen 325 MG tablet Commonly known as: TYLENOL Take 650 mg by mouth every 6 (six) hours as needed.   cholecalciferol 1000 units tablet Commonly known as: VITAMIN D Take 5,000 Units by mouth daily.   finasteride 5 MG tablet Commonly known as: PROSCAR Take 5 mg by mouth daily.   fluticasone 50 MCG/ACT nasal spray Commonly known as: FLONASE Place 1 spray into both nostrils daily.   labetalol 200 MG  tablet Commonly known as: NORMODYNE Take 1 tablet by mouth twice daily   losartan 100 MG tablet Commonly known as: COZAAR Take 1 tablet by mouth once daily   melatonin 3 MG Tabs tablet Take 3 mg by mouth at bedtime. Takes total of 6mg  HS   omeprazole 20 MG capsule Commonly known as: PRILOSEC Take 1 capsule (20 mg total) by mouth daily. TAKE 1 CAPSULE BY MOUTH ONCE DAILY AS NEEDED   rosuvastatin 20 MG tablet Commonly known as: CRESTOR Take 1 tablet by mouth once daily   vitamin B-6 500 MG tablet Take 500 mg by mouth daily.        Signature:  Chesley Mires, MD [place] Pager - [phone] 11/12/2020, 10:34 AM

## 2020-11-18 DIAGNOSIS — N138 Other obstructive and reflux uropathy: Secondary | ICD-10-CM | POA: Diagnosis not present

## 2020-11-18 DIAGNOSIS — N1831 Chronic kidney disease, stage 3a: Secondary | ICD-10-CM | POA: Diagnosis not present

## 2020-11-27 DIAGNOSIS — G4733 Obstructive sleep apnea (adult) (pediatric): Secondary | ICD-10-CM | POA: Diagnosis not present

## 2020-11-29 ENCOUNTER — Other Ambulatory Visit: Payer: Self-pay | Admitting: Pulmonary Disease

## 2020-11-29 DIAGNOSIS — H25042 Posterior subcapsular polar age-related cataract, left eye: Secondary | ICD-10-CM | POA: Diagnosis not present

## 2020-11-29 DIAGNOSIS — H5203 Hypermetropia, bilateral: Secondary | ICD-10-CM | POA: Diagnosis not present

## 2020-11-29 DIAGNOSIS — H52223 Regular astigmatism, bilateral: Secondary | ICD-10-CM | POA: Diagnosis not present

## 2020-11-29 DIAGNOSIS — H2513 Age-related nuclear cataract, bilateral: Secondary | ICD-10-CM | POA: Diagnosis not present

## 2020-11-29 DIAGNOSIS — H524 Presbyopia: Secondary | ICD-10-CM | POA: Diagnosis not present

## 2020-12-15 DIAGNOSIS — K219 Gastro-esophageal reflux disease without esophagitis: Secondary | ICD-10-CM | POA: Diagnosis not present

## 2020-12-15 DIAGNOSIS — G4733 Obstructive sleep apnea (adult) (pediatric): Secondary | ICD-10-CM | POA: Diagnosis not present

## 2020-12-27 ENCOUNTER — Other Ambulatory Visit: Payer: Self-pay | Admitting: Internal Medicine

## 2020-12-28 DIAGNOSIS — G4733 Obstructive sleep apnea (adult) (pediatric): Secondary | ICD-10-CM | POA: Diagnosis not present

## 2021-01-28 DIAGNOSIS — G4733 Obstructive sleep apnea (adult) (pediatric): Secondary | ICD-10-CM | POA: Diagnosis not present

## 2021-02-23 ENCOUNTER — Other Ambulatory Visit: Payer: Self-pay | Admitting: Internal Medicine

## 2021-02-23 DIAGNOSIS — K219 Gastro-esophageal reflux disease without esophagitis: Secondary | ICD-10-CM

## 2021-02-23 NOTE — Telephone Encounter (Signed)
Please refill as per office routine med refill policy (all routine meds to be refilled for 3 mo or monthly (per pt preference) up to one year from last visit, then month to month grace period for 3 mo, then further med refills will have to be denied) ? ?

## 2021-02-25 ENCOUNTER — Other Ambulatory Visit: Payer: Self-pay | Admitting: Internal Medicine

## 2021-02-25 DIAGNOSIS — K219 Gastro-esophageal reflux disease without esophagitis: Secondary | ICD-10-CM

## 2021-02-25 NOTE — Telephone Encounter (Signed)
Please refill as per office routine med refill policy (all routine meds to be refilled for 3 mo or monthly (per pt preference) up to one year from last visit, then month to month grace period for 3 mo, then further med refills will have to be denied) ? ?

## 2021-02-27 DIAGNOSIS — G4733 Obstructive sleep apnea (adult) (pediatric): Secondary | ICD-10-CM | POA: Diagnosis not present

## 2021-02-28 DIAGNOSIS — G4733 Obstructive sleep apnea (adult) (pediatric): Secondary | ICD-10-CM | POA: Diagnosis not present

## 2021-03-10 DIAGNOSIS — Z0000 Encounter for general adult medical examination without abnormal findings: Secondary | ICD-10-CM | POA: Diagnosis not present

## 2021-03-10 DIAGNOSIS — Z79899 Other long term (current) drug therapy: Secondary | ICD-10-CM | POA: Diagnosis not present

## 2021-03-10 DIAGNOSIS — N1831 Chronic kidney disease, stage 3a: Secondary | ICD-10-CM | POA: Diagnosis not present

## 2021-03-10 DIAGNOSIS — I129 Hypertensive chronic kidney disease with stage 1 through stage 4 chronic kidney disease, or unspecified chronic kidney disease: Secondary | ICD-10-CM | POA: Diagnosis not present

## 2021-03-10 DIAGNOSIS — K909 Intestinal malabsorption, unspecified: Secondary | ICD-10-CM | POA: Diagnosis not present

## 2021-03-10 DIAGNOSIS — G4733 Obstructive sleep apnea (adult) (pediatric): Secondary | ICD-10-CM | POA: Diagnosis not present

## 2021-03-10 DIAGNOSIS — K219 Gastro-esophageal reflux disease without esophagitis: Secondary | ICD-10-CM | POA: Diagnosis not present

## 2021-03-10 DIAGNOSIS — Z1389 Encounter for screening for other disorder: Secondary | ICD-10-CM | POA: Diagnosis not present

## 2021-03-10 DIAGNOSIS — E7800 Pure hypercholesterolemia, unspecified: Secondary | ICD-10-CM | POA: Diagnosis not present

## 2021-03-30 DIAGNOSIS — G4733 Obstructive sleep apnea (adult) (pediatric): Secondary | ICD-10-CM | POA: Diagnosis not present

## 2021-03-31 ENCOUNTER — Ambulatory Visit: Payer: Medicare Other | Admitting: Primary Care

## 2021-03-31 ENCOUNTER — Encounter: Payer: Self-pay | Admitting: Primary Care

## 2021-03-31 ENCOUNTER — Other Ambulatory Visit: Payer: Self-pay | Admitting: *Deleted

## 2021-03-31 ENCOUNTER — Other Ambulatory Visit: Payer: Self-pay

## 2021-03-31 DIAGNOSIS — G4733 Obstructive sleep apnea (adult) (pediatric): Secondary | ICD-10-CM

## 2021-03-31 NOTE — Assessment & Plan Note (Addendum)
-   He is 100% complaint with CPAP use. Experiencing moderate amount of airleaks and still having some residual events. Current pressure setting 9-18cm h20; AHI 11.6/hr. He would like [place] to lower pressure setting which I think is reasonable to try and see if these helps with airleaks. If not improvement with pressure adjustment likely needs either a mask fitting or CPAP titration study. Sending in DME order to have CPAP pressure changed to 7-17cm h20 (Download in 1 month). He will notify us if sleep worsens. Advised not to drive if experiencing excessive daytime sleepiness. Encourage he maintain healthy weight. FU in 3-6 months with Dr. Halford Chessman or Eustaquio Maize NP

## 2021-03-31 NOTE — Patient Instructions (Signed)
Recommendations: Continue to wear CPAP every night for minimum 4 to 6 hours or longer Do not drive if experiencing excessive daytime fatigue or somnolence Maintain healthy weight  Orders Adjust CPAP pressure 7-17cm h20   Follow-up: 3-6 months with either Dr. Halford Chessman or Eustaquio Maize NP

## 2021-03-31 NOTE — Progress Notes (Signed)
@Patient  ID: Brandon [name], male    DOB: June 02, 1940, 80 y.o.   MRN: [id number]  Chief Complaint  Patient presents with   Follow-up    Referring provider: Lajean Manes, MD  HPI: 80 year old male, former smoker quit in 02/09/1960. PMH significant for HTN, allergic rhinitis, OSA, GERD, hyperlipidemia, stage 3a CKD, hyperlipidemia,  hypersomnolence. Patient of Dr. Halford Chessman, last seen on 11/12/20.   03/31/2021- Interim hx  Patient presents today for 6 month follow-up. He is doing well today. He is compliant with CPAP. He reports experiencing airleaks and is still having events. He wears full face mask and is changing mask and headgear every 6 months. He is getting about 4 hours of sleep a night with his CPAP. He may take it off in the middle of the night and get another 2 hours of sleep. If he is having a good night he will get 5 straight hours. He will take 15 min nap during the day. No signs of narcolepsy. Denies excessive daytime sleepinesss.   Airview download 02/28/21-03/29/21 Usage 30/30 days (100%); 87% > 4 hours Average usage 4 hours 59 mins Pressure 9-18cm h20 (17.6- 95%) Airleaks 22.2L/min AHI 11/6   No Known Allergies  Immunization History  Administered Date(s) Administered   DTaP 11/13/2018   Influenza Split 02/25/2020   Influenza, High Dose Seasonal PF 02/25/2020, 01/19/2021   Influenza-Unspecified 03/02/2015, 03/02/2017, 01/14/2018   PFIZER(Purple Top)SARS-COV-2 Vaccination 06/12/2019, 07/07/2019, 02/04/2020, 09/29/2020   Pfizer Covid-19 Vaccine Bivalent Booster 5yrs & up 03/09/2021   Pneumococcal Conjugate-13 12/16/2013   Pneumococcal Polysaccharide-23 05/01/2004, 11/29/2010, 04/05/2020   Td 11/23/2008   Tdap 11/13/2018   Zoster, Live 04/20/2009, 08/30/2020, 12/09/2020    Past Medical History:  Diagnosis Date   Arthritis of hand, degenerative 05/08/2011   BENIGN PROSTATIC HYPERTROPHY 11/15/2006   CAROTID ARTERY STENOSIS, BILATERAL 11/14/2007   COLONIC POLYPS, HX  OF 11/15/2006   ERECTILE DYSFUNCTION 11/15/2006   GERD 11/15/2006   HYPERLIPIDEMIA 11/15/2006   HYPERTENSION 11/15/2006   borderline   Lipoma 11/29/2010   LOW BACK PAIN 11/14/2007   recurrent   PARESTHESIA, HANDS 11/23/2008   PERIPHERAL VASCULAR DISEASE 11/25/2009   right carotid 60-79% July 2011   PSA, INCREASED 11/14/2007   Sciatica 11/15/2006   with known lumbardegenerative disk disease and degenerative joint disease    Tobacco History: Social History   Tobacco Use  Smoking Status Former   Packs/day: 0.50   Years: 2.00   Pack years: 1.00   Types: Cigarettes   Quit date: 02/09/1960   Years since quitting: 61.1  Smokeless Tobacco Never   Counseling given: Not Answered   Outpatient Medications Prior to Visit  Medication Sig Dispense Refill   acetaminophen (TYLENOL) 325 MG tablet Take 650 mg by mouth every 6 (six) hours as needed.     cholecalciferol (VITAMIN D) 1000 units tablet Take 5,000 Units by mouth daily.      finasteride (PROSCAR) 5 MG tablet Take 5 mg by mouth daily.     labetalol (NORMODYNE) 200 MG tablet Take 1 tablet by mouth twice daily 180 tablet 0   losartan (COZAAR) 100 MG tablet Take 1 tablet by mouth once daily 90 tablet 0   melatonin 3 MG TABS tablet Take 3 mg by mouth at bedtime. Takes total of 6mg  HS     omeprazole (PRILOSEC) 20 MG capsule Take 1 capsule (20 mg total) by mouth daily. TAKE 1 CAPSULE BY MOUTH ONCE DAILY AS NEEDED 90 capsule 3   rosuvastatin (CRESTOR) 20 MG tablet  Take 1 tablet by mouth once daily 90 tablet 0   fluticasone (FLONASE) 50 MCG/ACT nasal spray Use 1 spray(s) in each nostril once daily (Patient not taking: Reported on 03/31/2021) 16 g 1   Pyridoxine HCl (VITAMIN B-6) 500 MG tablet Take 500 mg by mouth daily. (Patient not taking: Reported on 03/31/2021)     No facility-administered medications prior to visit.    Review of Systems  Review of Systems  Constitutional: Negative.   HENT: Negative.    Respiratory: Negative.     Cardiovascular: Negative.     Physical Exam  BP 114/60 (BP Location: Left Arm, Patient Position: Sitting, Cuff Size: Normal)   Pulse 65   Temp 97.9 F (36.6 C) (Oral)   Ht 5\' 7"  (1.702 m)   Wt 196 lb 3.2 oz (89 kg)   SpO2 96%   BMI 30.73 kg/m  Physical Exam Constitutional:      Appearance: Normal appearance.  HENT:     Head: Normocephalic and atraumatic.     Mouth/Throat:     Mouth: Mucous membranes are moist.     Pharynx: Oropharynx is clear.  Cardiovascular:     Rate and Rhythm: Normal rate and regular rhythm.  Pulmonary:     Effort: Pulmonary effort is normal.     Breath sounds: Normal breath sounds. No wheezing, rhonchi or rales.  Musculoskeletal:        General: Normal range of motion.     Cervical back: Normal range of motion and neck supple.  Skin:    General: Skin is warm and dry.  Neurological:     General: No focal deficit present.     Mental Status: He is alert and oriented to person, place, and time. Mental status is at baseline.  Psychiatric:        Mood and Affect: Mood normal.        Behavior: Behavior normal.        Thought Content: Thought content normal.        Judgment: Judgment normal.     Lab Results:  CBC    Component Value Date/Time   WBC 6.6 03/11/2020 0859   RBC 4.57 03/11/2020 0859   HGB 13.8 03/11/2020 0859   HCT 41.0 03/11/2020 0859   PLT 267.0 03/11/2020 0859   MCV 89.7 03/11/2020 0859   MCHC 33.8 03/11/2020 0859   RDW 13.8 03/11/2020 0859   LYMPHSABS 1.1 03/11/2020 0859   MONOABS 0.6 03/11/2020 0859   EOSABS 0.3 03/11/2020 0859   BASOSABS 0.1 03/11/2020 0859    BMET    Component Value Date/Time   NA 136 02/18/2020 0945   K 4.3 02/18/2020 0945   CL 104 02/18/2020 0945   CO2 25 02/18/2020 0945   GLUCOSE 90 02/18/2020 0945   BUN 25 (H) 02/18/2020 0945   CREATININE 1.21 02/18/2020 0945   CALCIUM 9.4 02/18/2020 0945   GFRNONAA 88.66 11/19/2009 0809   GFRAA 86 11/12/2007 0753    BNP No results found for:  BNP  ProBNP No results found for: PROBNP  Imaging: No results found.   Assessment & Plan:   OSA (obstructive sleep apnea) - He is 100% complaint with CPAP use. Experiencing moderate amount of airleaks and still having some residual events. Current pressure setting 9-18cm h20; AHI 11.6/hr. He would like [place] to lower pressure setting which I think is reasonable to try and see if these helps with airleaks. If not improvement with pressure adjustment likely needs either a mask fitting or  CPAP titration study. Sending in DME order to have CPAP pressure changed to 7-17cm h20 (Download in 1 month). He will notify us if sleep worsens. Advised not to drive if experiencing excessive daytime sleepiness. Encourage he maintain healthy weight. FU in 3-6 months with Dr. Halford Chessman or Eustaquio Maize NP    Martyn Ehrich, NP 03/31/2021

## 2021-03-31 NOTE — Progress Notes (Signed)
Reviewed and agree with assessment/plan.   Chesley Mires, MD [facility] Pulmonary/Critical Care 03/31/2021, 2:47 PM Pager:  [phone]

## 2021-03-31 NOTE — Addendum Note (Signed)
Addended by: Vanessa Barbara on: 03/31/2021 10:00 AM   Modules accepted: Orders

## 2021-04-01 NOTE — Telephone Encounter (Signed)
EW please advise on the mychart message from the pt.  Thanks

## 2021-04-01 NOTE — Telephone Encounter (Signed)
Yes that is a terrific number. Our goal is <5 or close to that. A occasional elevated reading may happen on bad night of sleep. I would keep theses pressure settings, looks like they are working well. Let me know if anything changes

## 2021-04-29 DIAGNOSIS — G4733 Obstructive sleep apnea (adult) (pediatric): Secondary | ICD-10-CM | POA: Diagnosis not present

## 2021-05-12 DIAGNOSIS — N529 Male erectile dysfunction, unspecified: Secondary | ICD-10-CM | POA: Diagnosis not present

## 2021-05-12 DIAGNOSIS — N138 Other obstructive and reflux uropathy: Secondary | ICD-10-CM | POA: Diagnosis not present

## 2021-05-12 DIAGNOSIS — R972 Elevated prostate specific antigen [PSA]: Secondary | ICD-10-CM | POA: Diagnosis not present

## 2021-05-12 DIAGNOSIS — N1831 Chronic kidney disease, stage 3a: Secondary | ICD-10-CM | POA: Diagnosis not present

## 2021-05-12 DIAGNOSIS — N401 Enlarged prostate with lower urinary tract symptoms: Secondary | ICD-10-CM | POA: Diagnosis not present

## 2021-05-31 ENCOUNTER — Other Ambulatory Visit: Payer: Self-pay

## 2021-05-31 ENCOUNTER — Ambulatory Visit: Payer: PPO | Admitting: Pulmonary Disease

## 2021-05-31 ENCOUNTER — Encounter: Payer: Self-pay | Admitting: Pulmonary Disease

## 2021-05-31 VITALS — BP 124/70 | HR 55 | Temp 98.2°F | Ht 67.5 in | Wt 192.8 lb

## 2021-05-31 DIAGNOSIS — G4733 Obstructive sleep apnea (adult) (pediatric): Secondary | ICD-10-CM

## 2021-05-31 NOTE — Progress Notes (Signed)
[place] [facility], Critical Care, and Sleep Medicine  Chief Complaint  Patient presents with   Follow-up    Cpap compliance     Constitutional:  BP 124/70 (BP Location: Left Arm, Cuff Size: Normal)    Pulse (!) 55    Temp 98.2 F (36.8 C) (Oral)    Ht 5' 7.5" (1.715 m)    Wt 192 lb 12.8 oz (87.5 kg)    SpO2 98%    BMI 29.75 kg/m   Past Medical History:  BPH, PAD, Colon polyps, ED, GERD, HLD, HTN, Back pain  Past Surgical History:  His  has a past surgical history that includes Inguinal hernia repair (2001); Prostate biopsy (Fall 2009); Colonoscopy; Mass excision (04/06/2011); and Carpal tunnel release (Bilateral).  Brief Summary:  Brandon [name] is a 81 y.o. male with remote history of smoking presents for assessment of snoring and daytime sleepiness.       Subjective:   He was having trouble with dry mouth.  Tried increasing humidifier and then got water build up in his tube.  Mask fit is okay.  Dry mouth not bad.  Saw ENT.  Told to use PPI daily and globus sensation better.  Physical Exam:   Appearance - well kempt   ENMT - no sinus tenderness, no oral exudate, no LAN, Mallampati 4 airway, no stridor, scalloped tongue  Respiratory - equal breath sounds bilaterally, no wheezing or rales  CV - s1s2 regular rate and rhythm, no murmurs  Ext - no clubbing, no edema  Skin - no rashes  Psych - normal mood and affect    Sleep Tests:  HST 03/22/20 >> AHI 22.9, SpO2 low 62% Auto CPAP 04/30/21 to 05/29/21 >> used on 30 of 30 nights with average 6 hrs 32 min.  Average AHI 9.9 with median CPAP 12 and 95 th percentile CPAP 16 cm H2O  Cardiac Tests:  Echo 06/16/19 >> EF 55 to 60^, grade 1 DD, mod LVH, RVSP 13.6 mmHg, mod LA dilation  Social History:  He  reports that he quit smoking about 61 years ago. His smoking use included cigarettes. He has a 1.00 pack-year smoking history. He has never used smokeless tobacco. He reports current alcohol use. He reports that he  does not use drugs.  Family History:  His family history includes Cancer in his mother and another family member; Depression in his mother; Heart disease in his father; Stroke in his father.     Assessment/Plan:   Obstructive sleep apnea. - he is compliant with CPAP and reports benefit from therapy - he uses [facility] for his DME - will change his auto CPAP to 9 - 20 cm H2O - discussed techniques to help avoid rain out  Globus sensation. - related to reflux and improved with regular use of PPI  Time Spent Involved in Patient Care on Day of Examination:  25 minutes  Follow up:   Patient Instructions  Follow up in 1 year  Medication List:   Allergies as of 05/31/2021   No Known Allergies      Medication List        Accurate as of May 31, 2021  3:50 PM. If you have any questions, ask your nurse or doctor.          acetaminophen 325 MG tablet Commonly known as: TYLENOL Take 650 mg by mouth every 6 (six) hours as needed.   Alpha-Lipoic Acid 600 MG Caps Take by mouth.   cholecalciferol 1000 units tablet Commonly  known as: VITAMIN D Take 5,000 Units by mouth daily.   finasteride 5 MG tablet Commonly known as: PROSCAR Take 5 mg by mouth daily.   labetalol 200 MG tablet Commonly known as: NORMODYNE Take 1 tablet by mouth twice daily   losartan 100 MG tablet Commonly known as: COZAAR Take 1 tablet by mouth once daily   melatonin 3 MG Tabs tablet Take 3 mg by mouth at bedtime. Takes total of 6mg  HS   omeprazole 20 MG capsule Commonly known as: PRILOSEC Take 1 capsule (20 mg total) by mouth daily. TAKE 1 CAPSULE BY MOUTH ONCE DAILY AS NEEDED   rosuvastatin 20 MG tablet Commonly known as: CRESTOR Take 1 tablet by mouth once daily   vitamin C 500 MG tablet Commonly known as: ASCORBIC ACID Take 500 mg by mouth daily.        Signature:  Chesley Mires, MD [place] Pager - [phone] 05/31/2021, 3:50 PM

## 2021-05-31 NOTE — Patient Instructions (Signed)
Follow up in 1 year.

## 2021-06-28 DIAGNOSIS — G4733 Obstructive sleep apnea (adult) (pediatric): Secondary | ICD-10-CM | POA: Diagnosis not present

## 2021-07-28 DIAGNOSIS — G4733 Obstructive sleep apnea (adult) (pediatric): Secondary | ICD-10-CM | POA: Diagnosis not present

## 2021-07-29 DIAGNOSIS — B078 Other viral warts: Secondary | ICD-10-CM | POA: Diagnosis not present

## 2021-07-29 DIAGNOSIS — D225 Melanocytic nevi of trunk: Secondary | ICD-10-CM | POA: Diagnosis not present

## 2021-07-29 DIAGNOSIS — X32XXXD Exposure to sunlight, subsequent encounter: Secondary | ICD-10-CM | POA: Diagnosis not present

## 2021-07-29 DIAGNOSIS — L570 Actinic keratosis: Secondary | ICD-10-CM | POA: Diagnosis not present

## 2021-07-29 DIAGNOSIS — Z1283 Encounter for screening for malignant neoplasm of skin: Secondary | ICD-10-CM | POA: Diagnosis not present

## 2021-08-28 DIAGNOSIS — G4733 Obstructive sleep apnea (adult) (pediatric): Secondary | ICD-10-CM | POA: Diagnosis not present

## 2021-09-12 DIAGNOSIS — I129 Hypertensive chronic kidney disease with stage 1 through stage 4 chronic kidney disease, or unspecified chronic kidney disease: Secondary | ICD-10-CM | POA: Diagnosis not present

## 2021-09-12 DIAGNOSIS — N1831 Chronic kidney disease, stage 3a: Secondary | ICD-10-CM | POA: Diagnosis not present

## 2021-09-27 DIAGNOSIS — G4733 Obstructive sleep apnea (adult) (pediatric): Secondary | ICD-10-CM | POA: Diagnosis not present

## 2021-09-28 DIAGNOSIS — G4733 Obstructive sleep apnea (adult) (pediatric): Secondary | ICD-10-CM | POA: Diagnosis not present

## 2021-10-12 DIAGNOSIS — M25512 Pain in left shoulder: Secondary | ICD-10-CM | POA: Diagnosis not present

## 2021-10-12 DIAGNOSIS — M25511 Pain in right shoulder: Secondary | ICD-10-CM | POA: Diagnosis not present

## 2021-10-12 DIAGNOSIS — I129 Hypertensive chronic kidney disease with stage 1 through stage 4 chronic kidney disease, or unspecified chronic kidney disease: Secondary | ICD-10-CM | POA: Diagnosis not present

## 2021-10-12 DIAGNOSIS — N1831 Chronic kidney disease, stage 3a: Secondary | ICD-10-CM | POA: Diagnosis not present

## 2022-01-12 DIAGNOSIS — N1831 Chronic kidney disease, stage 3a: Secondary | ICD-10-CM | POA: Diagnosis not present

## 2022-01-12 DIAGNOSIS — N529 Male erectile dysfunction, unspecified: Secondary | ICD-10-CM | POA: Diagnosis not present

## 2022-01-12 DIAGNOSIS — R972 Elevated prostate specific antigen [PSA]: Secondary | ICD-10-CM | POA: Diagnosis not present

## 2022-01-12 DIAGNOSIS — N401 Enlarged prostate with lower urinary tract symptoms: Secondary | ICD-10-CM | POA: Diagnosis not present

## 2022-01-12 DIAGNOSIS — N138 Other obstructive and reflux uropathy: Secondary | ICD-10-CM | POA: Diagnosis not present

## 2022-01-30 DIAGNOSIS — X32XXXD Exposure to sunlight, subsequent encounter: Secondary | ICD-10-CM | POA: Diagnosis not present

## 2022-01-30 DIAGNOSIS — L570 Actinic keratosis: Secondary | ICD-10-CM | POA: Diagnosis not present

## 2022-02-24 DIAGNOSIS — H268 Other specified cataract: Secondary | ICD-10-CM | POA: Diagnosis not present

## 2022-02-24 DIAGNOSIS — H5203 Hypermetropia, bilateral: Secondary | ICD-10-CM | POA: Diagnosis not present

## 2022-02-24 DIAGNOSIS — H25813 Combined forms of age-related cataract, bilateral: Secondary | ICD-10-CM | POA: Diagnosis not present

## 2022-02-24 DIAGNOSIS — H52203 Unspecified astigmatism, bilateral: Secondary | ICD-10-CM | POA: Diagnosis not present

## 2022-02-24 DIAGNOSIS — H524 Presbyopia: Secondary | ICD-10-CM | POA: Diagnosis not present

## 2022-03-02 DIAGNOSIS — G4733 Obstructive sleep apnea (adult) (pediatric): Secondary | ICD-10-CM | POA: Diagnosis not present

## 2022-03-15 DIAGNOSIS — G4733 Obstructive sleep apnea (adult) (pediatric): Secondary | ICD-10-CM | POA: Diagnosis not present

## 2022-05-05 ENCOUNTER — Other Ambulatory Visit: Payer: Self-pay | Admitting: Internal Medicine

## 2022-05-05 ENCOUNTER — Ambulatory Visit
Admission: RE | Admit: 2022-05-05 | Discharge: 2022-05-05 | Disposition: A | Discharge status: Home / Self Care | Payer: PPO | Source: Ambulatory Visit | Attending: Internal Medicine | Admitting: Internal Medicine

## 2022-05-05 DIAGNOSIS — R2681 Unsteadiness on feet: Secondary | ICD-10-CM | POA: Diagnosis not present

## 2022-05-05 DIAGNOSIS — Z79899 Other long term (current) drug therapy: Secondary | ICD-10-CM | POA: Diagnosis not present

## 2022-05-05 DIAGNOSIS — G4733 Obstructive sleep apnea (adult) (pediatric): Secondary | ICD-10-CM | POA: Diagnosis not present

## 2022-05-05 DIAGNOSIS — K219 Gastro-esophageal reflux disease without esophagitis: Secondary | ICD-10-CM | POA: Diagnosis not present

## 2022-05-05 DIAGNOSIS — E7800 Pure hypercholesterolemia, unspecified: Secondary | ICD-10-CM | POA: Diagnosis not present

## 2022-05-05 DIAGNOSIS — G629 Polyneuropathy, unspecified: Secondary | ICD-10-CM | POA: Diagnosis not present

## 2022-05-05 DIAGNOSIS — Z1331 Encounter for screening for depression: Secondary | ICD-10-CM | POA: Diagnosis not present

## 2022-05-05 DIAGNOSIS — I129 Hypertensive chronic kidney disease with stage 1 through stage 4 chronic kidney disease, or unspecified chronic kidney disease: Secondary | ICD-10-CM | POA: Diagnosis not present

## 2022-05-05 DIAGNOSIS — K909 Intestinal malabsorption, unspecified: Secondary | ICD-10-CM | POA: Diagnosis not present

## 2022-05-05 DIAGNOSIS — M25561 Pain in right knee: Secondary | ICD-10-CM

## 2022-05-05 DIAGNOSIS — N1831 Chronic kidney disease, stage 3a: Secondary | ICD-10-CM | POA: Diagnosis not present

## 2022-05-05 DIAGNOSIS — Z0000 Encounter for general adult medical examination without abnormal findings: Secondary | ICD-10-CM | POA: Diagnosis not present

## 2022-05-05 DIAGNOSIS — G8929 Other chronic pain: Secondary | ICD-10-CM | POA: Diagnosis not present

## 2022-05-10 DIAGNOSIS — H268 Other specified cataract: Secondary | ICD-10-CM | POA: Diagnosis not present

## 2022-05-10 DIAGNOSIS — H401431 Capsular glaucoma with pseudoexfoliation of lens, bilateral, mild stage: Secondary | ICD-10-CM | POA: Diagnosis not present

## 2022-05-11 DIAGNOSIS — I10 Essential (primary) hypertension: Secondary | ICD-10-CM | POA: Diagnosis not present

## 2022-05-22 DIAGNOSIS — M25561 Pain in right knee: Secondary | ICD-10-CM | POA: Diagnosis not present

## 2022-05-25 ENCOUNTER — Ambulatory Visit (HOSPITAL_BASED_OUTPATIENT_CLINIC_OR_DEPARTMENT_OTHER): Payer: PPO | Admitting: Pulmonary Disease

## 2022-06-08 ENCOUNTER — Ambulatory Visit (HOSPITAL_BASED_OUTPATIENT_CLINIC_OR_DEPARTMENT_OTHER): Payer: PPO | Admitting: Pulmonary Disease

## 2022-07-04 ENCOUNTER — Ambulatory Visit (HOSPITAL_BASED_OUTPATIENT_CLINIC_OR_DEPARTMENT_OTHER): Payer: PPO | Admitting: Pulmonary Disease

## 2022-07-04 ENCOUNTER — Ambulatory Visit (INDEPENDENT_AMBULATORY_CARE_PROVIDER_SITE_OTHER): Payer: PPO | Admitting: Pulmonary Disease

## 2022-07-04 ENCOUNTER — Encounter (HOSPITAL_BASED_OUTPATIENT_CLINIC_OR_DEPARTMENT_OTHER): Payer: Self-pay | Admitting: Pulmonary Disease

## 2022-07-04 VITALS — BP 120/60 | HR 68 | Temp 98.2°F | Ht 67.5 in | Wt 191.4 lb

## 2022-07-04 DIAGNOSIS — G4733 Obstructive sleep apnea (adult) (pediatric): Secondary | ICD-10-CM

## 2022-07-04 NOTE — Patient Instructions (Signed)
Will arrange for home sleep study and call with results  Follow up in 1 year

## 2022-07-04 NOTE — Progress Notes (Signed)
[place], Critical Care, and Sleep Medicine  Chief Complaint  Patient presents with   Follow-up    Pt said he has been having problems with sleeping at night with the CPAP on.     Constitutional:  BP 120/60 (BP Location: Left Arm, Patient Position: Sitting, Cuff Size: Normal)   Pulse 68   Temp 98.2 F (36.8 C) (Oral)   Ht 5' 7.5" (1.715 m)   Wt 191 lb 5.8 oz (86.8 kg)   SpO2 98% Comment: RA  BMI 29.53 kg/m   Past Medical History:  BPH, PAD, Colon polyps, ED, GERD, HLD, HTN, Back pain  Past Surgical History:  His  has a past surgical history that includes Inguinal hernia repair (2001); Prostate biopsy (Fall 2009); Colonoscopy; Mass excision (04/06/2011); and Carpal tunnel release (Bilateral).  Brief Summary:  Brandon [name] is a 82 y.o. male with obstructive sleep apnea.      Subjective:   He uses CPAP nightly.  Doesn't like mask.  He wants to try a hybrid mask, but DME told him he can't get one until May unless he pays for it.    He thinks his sleep has gotten better since he was treated with omeprazole for reflux.  He is wondering if this has improved his sleep apnea.  Physical Exam:   Appearance - well kempt   ENMT - no sinus tenderness, no oral exudate, no LAN, Mallampati 4 airway, no stridor, scalloped tongue  Respiratory - equal breath sounds bilaterally, no wheezing or rales  CV - s1s2 regular rate and rhythm, no murmurs  Ext - no clubbing, no edema  Skin - no rashes  Psych - normal mood and affect    Sleep Tests:  HST 03/22/20 >> AHI 22.9, SpO2 low 62% Auto CPAP 06/03/22 to 07/02/22 >> used on 29 of 30 nights with average 4 hrs 27 min.  Average AHI 7.5 with median CPAP 13 and 95 th percentile CPAP 19 cm H2O  Cardiac Tests:  Echo 06/16/19 >> EF 55 to 60^, grade 1 DD, mod LVH, RVSP 13.6 mmHg, mod LA dilation  Social History:  He  reports that he quit smoking about 62 years ago. His smoking use included cigarettes. He has a 1.00 pack-year  smoking history. He has never used smokeless tobacco. He reports current alcohol use. He reports that he does not use drugs.  Family History:  His family history includes Cancer in his mother and another family member; Depression in his mother; Heart disease in his father; Stroke in his father.     Assessment/Plan:   Obstructive sleep apnea. - he is compliant with CPAP and reports benefit from therapy - he uses [facility] for his DME - continue auto CPAP 9 to 20 cm H2O - will arrange for repeat home sleep study to assess current status and then determine if he could consider alternative therapies - he plans to try an Airfit F30 hybrid mask  Globus sensation. - related to reflux and improved with regular use of PPI  Time Spent Involved in Patient Care on Day of Examination:  26 minutes  Follow up:   Patient Instructions  Will arrange for home sleep study and call with results  Follow up in 1 year  Medication List:   Allergies as of 07/04/2022   No Known Allergies      Medication List        Accurate as of July 04, 2022  4:19 PM. If you have any questions, ask your  nurse or doctor.          STOP taking these medications    ascorbic acid 500 MG tablet Commonly known as: VITAMIN C Stopped by: Chesley Mires, MD   melatonin 3 MG Tabs tablet Stopped by: Chesley Mires, MD       TAKE these medications    acetaminophen 325 MG tablet Commonly known as: TYLENOL Take 650 mg by mouth every 6 (six) hours as needed.   Alpha-Lipoic Acid 600 MG Caps Take by mouth.   cholecalciferol 1000 units tablet Commonly known as: VITAMIN D Take 5,000 Units by mouth daily.   finasteride 5 MG tablet Commonly known as: PROSCAR Take 5 mg by mouth daily.   hydrochlorothiazide 25 MG tablet Commonly known as: HYDRODIURIL Take 1 tablet by mouth every morning.   labetalol 200 MG tablet Commonly known as: NORMODYNE Take 1 tablet by mouth twice daily   latanoprost 0.005 % ophthalmic  solution Commonly known as: XALATAN Place 1 drop into both eyes at bedtime.   losartan 100 MG tablet Commonly known as: COZAAR Take 1 tablet by mouth once daily   omeprazole 20 MG capsule Commonly known as: PRILOSEC Take 1 capsule (20 mg total) by mouth daily. TAKE 1 CAPSULE BY MOUTH ONCE DAILY AS NEEDED   rosuvastatin 20 MG tablet Commonly known as: CRESTOR Take 1 tablet by mouth once daily        Signature:  Chesley Mires, MD [place] Pager - [phone] 07/04/2022, 4:19 PM

## 2022-07-20 DIAGNOSIS — H401431 Capsular glaucoma with pseudoexfoliation of lens, bilateral, mild stage: Secondary | ICD-10-CM | POA: Diagnosis not present

## 2022-08-07 ENCOUNTER — Telehealth: Payer: Self-pay | Admitting: Pulmonary Disease

## 2022-08-07 NOTE — Telephone Encounter (Signed)
ATC patient. LVMTCB. 

## 2022-08-07 NOTE — Telephone Encounter (Signed)
Pt was not scheduled for hst but I put a note on the order that VS states does not need to do.  Nothing further needed.

## 2022-08-07 NOTE — Telephone Encounter (Signed)
Patient states does not want to do the home sleep test. States got new mask and is working well. Patient phone number is [phone].

## 2022-08-07 NOTE — Telephone Encounter (Signed)
Okay to cancel home sleep study.

## 2022-08-07 NOTE — Telephone Encounter (Signed)
Pt called the office about not wanting to do another sleep study due to his new mask working well.  Routing to Dr. Craige Cotta and [place] as an [place]ain Childes.

## 2022-08-07 NOTE — Telephone Encounter (Signed)
Patient is needing sleep study canceled. Routing to [facility] pool.

## 2022-08-09 DIAGNOSIS — R2681 Unsteadiness on feet: Secondary | ICD-10-CM | POA: Diagnosis not present

## 2022-08-09 DIAGNOSIS — L290 Pruritus ani: Secondary | ICD-10-CM | POA: Diagnosis not present

## 2022-08-09 DIAGNOSIS — G8929 Other chronic pain: Secondary | ICD-10-CM | POA: Diagnosis not present

## 2022-08-09 DIAGNOSIS — I129 Hypertensive chronic kidney disease with stage 1 through stage 4 chronic kidney disease, or unspecified chronic kidney disease: Secondary | ICD-10-CM | POA: Diagnosis not present

## 2022-08-09 DIAGNOSIS — N1831 Chronic kidney disease, stage 3a: Secondary | ICD-10-CM | POA: Diagnosis not present

## 2022-08-09 DIAGNOSIS — E7800 Pure hypercholesterolemia, unspecified: Secondary | ICD-10-CM | POA: Diagnosis not present

## 2022-08-09 DIAGNOSIS — M25561 Pain in right knee: Secondary | ICD-10-CM | POA: Diagnosis not present

## 2022-08-25 DIAGNOSIS — R748 Abnormal levels of other serum enzymes: Secondary | ICD-10-CM | POA: Diagnosis not present

## 2022-09-01 DIAGNOSIS — M25561 Pain in right knee: Secondary | ICD-10-CM | POA: Diagnosis not present

## 2022-09-11 DIAGNOSIS — X32XXXD Exposure to sunlight, subsequent encounter: Secondary | ICD-10-CM | POA: Diagnosis not present

## 2022-09-11 DIAGNOSIS — L570 Actinic keratosis: Secondary | ICD-10-CM | POA: Diagnosis not present

## 2022-09-11 DIAGNOSIS — B028 Zoster with other complications: Secondary | ICD-10-CM | POA: Diagnosis not present

## 2022-09-13 DIAGNOSIS — G4733 Obstructive sleep apnea (adult) (pediatric): Secondary | ICD-10-CM | POA: Diagnosis not present

## 2022-10-16 DIAGNOSIS — M1711 Unilateral primary osteoarthritis, right knee: Secondary | ICD-10-CM | POA: Diagnosis not present

## 2022-10-26 DIAGNOSIS — N1831 Chronic kidney disease, stage 3a: Secondary | ICD-10-CM | POA: Diagnosis not present

## 2022-10-26 DIAGNOSIS — R972 Elevated prostate specific antigen [PSA]: Secondary | ICD-10-CM | POA: Diagnosis not present

## 2022-10-26 DIAGNOSIS — N401 Enlarged prostate with lower urinary tract symptoms: Secondary | ICD-10-CM | POA: Diagnosis not present

## 2022-10-26 DIAGNOSIS — N529 Male erectile dysfunction, unspecified: Secondary | ICD-10-CM | POA: Diagnosis not present

## 2022-10-26 DIAGNOSIS — N138 Other obstructive and reflux uropathy: Secondary | ICD-10-CM | POA: Diagnosis not present

## 2022-12-15 DIAGNOSIS — H401431 Capsular glaucoma with pseudoexfoliation of lens, bilateral, mild stage: Secondary | ICD-10-CM | POA: Diagnosis not present

## 2022-12-15 DIAGNOSIS — H25813 Combined forms of age-related cataract, bilateral: Secondary | ICD-10-CM | POA: Diagnosis not present

## 2023-03-12 DIAGNOSIS — X32XXXD Exposure to sunlight, subsequent encounter: Secondary | ICD-10-CM | POA: Diagnosis not present

## 2023-03-12 DIAGNOSIS — L570 Actinic keratosis: Secondary | ICD-10-CM | POA: Diagnosis not present

## 2023-03-16 DIAGNOSIS — G4733 Obstructive sleep apnea (adult) (pediatric): Secondary | ICD-10-CM | POA: Diagnosis not present
